# Patient Record
Sex: Female | Born: 1985 | Hispanic: No | Marital: Married | Smoking: Never smoker
Health system: Southern US, Community
[De-identification: ages and names within clinical notes are randomized; demographics above are authoritative.]

## PROBLEM LIST (undated history)

## (undated) ENCOUNTER — Inpatient Hospital Stay (HOSPITAL_COMMUNITY): Payer: Self-pay

## (undated) DIAGNOSIS — B019 Varicella without complication: Secondary | ICD-10-CM

## (undated) DIAGNOSIS — Q059 Spina bifida, unspecified: Secondary | ICD-10-CM

## (undated) DIAGNOSIS — Z789 Other specified health status: Secondary | ICD-10-CM

## (undated) HISTORY — DX: Varicella without complication: B01.9

---

## 2005-10-21 HISTORY — PX: BREAST BIOPSY: SHX20

## 2005-10-21 HISTORY — PX: BREAST SURGERY: SHX581

## 2015-04-18 ENCOUNTER — Encounter (HOSPITAL_COMMUNITY): Payer: Self-pay | Admitting: Emergency Medicine

## 2015-04-18 DIAGNOSIS — M549 Dorsalgia, unspecified: Secondary | ICD-10-CM | POA: Insufficient documentation

## 2015-04-18 DIAGNOSIS — B349 Viral infection, unspecified: Secondary | ICD-10-CM | POA: Diagnosis not present

## 2015-04-18 DIAGNOSIS — R109 Unspecified abdominal pain: Secondary | ICD-10-CM | POA: Insufficient documentation

## 2015-04-18 DIAGNOSIS — R110 Nausea: Secondary | ICD-10-CM | POA: Diagnosis not present

## 2015-04-18 DIAGNOSIS — R600 Localized edema: Secondary | ICD-10-CM | POA: Insufficient documentation

## 2015-04-18 DIAGNOSIS — Z3202 Encounter for pregnancy test, result negative: Secondary | ICD-10-CM | POA: Insufficient documentation

## 2015-04-18 DIAGNOSIS — R000 Tachycardia, unspecified: Secondary | ICD-10-CM | POA: Diagnosis not present

## 2015-04-18 LAB — CBC
HCT: 38.5 % (ref 36.0–46.0)
Hemoglobin: 13.5 g/dL (ref 12.0–15.0)
MCH: 29.2 pg (ref 26.0–34.0)
MCHC: 35.1 g/dL (ref 30.0–36.0)
MCV: 83.3 fL (ref 78.0–100.0)
PLATELETS: 179 10*3/uL (ref 150–400)
RBC: 4.62 MIL/uL (ref 3.87–5.11)
RDW: 12.3 % (ref 11.5–15.5)
WBC: 22.7 10*3/uL — ABNORMAL HIGH (ref 4.0–10.5)

## 2015-04-18 LAB — BASIC METABOLIC PANEL
ANION GAP: 8 (ref 5–15)
BUN: 8 mg/dL (ref 6–20)
CHLORIDE: 103 mmol/L (ref 101–111)
CO2: 24 mmol/L (ref 22–32)
CREATININE: 0.8 mg/dL (ref 0.44–1.00)
Calcium: 9 mg/dL (ref 8.9–10.3)
GFR calc Af Amer: >60 mL/min (ref >60)
GFR calc non Af Amer: >60 mL/min (ref >60)
Glucose, Bld: 118 mg/dL — ABNORMAL HIGH (ref 65–99)
Potassium: 3.8 mmol/L (ref 3.5–5.1)
Sodium: 135 mmol/L (ref 135–145)

## 2015-04-18 LAB — URINALYSIS, ROUTINE W REFLEX MICROSCOPIC
Glucose, UA: NEGATIVE mg/dL
KETONES UR: NEGATIVE mg/dL
Leukocytes, UA: NEGATIVE
Nitrite: NEGATIVE
Protein, ur: NEGATIVE mg/dL
Specific Gravity, Urine: 1.018 (ref 1.005–1.030)
UROBILINOGEN UA: 1 mg/dL (ref 0.0–1.0)
pH: 6 (ref 5.0–8.0)

## 2015-04-18 LAB — URINE MICROSCOPIC-ADD ON

## 2015-04-18 LAB — POC URINE PREG, ED: Preg Test, Ur: NEGATIVE

## 2015-04-18 NOTE — ED Notes (Addendum)
Pt. arrived with EMS from home reports bilateral flank pain with headache, emesis and low grade fever onset this morning . Denies dysuria or hematuria.

## 2015-04-19 ENCOUNTER — Emergency Department (HOSPITAL_COMMUNITY): Payer: BLUE CROSS/BLUE SHIELD

## 2015-04-19 ENCOUNTER — Emergency Department (HOSPITAL_COMMUNITY)
Admission: EM | Admit: 2015-04-19 | Discharge: 2015-04-19 | Disposition: A | Discharge status: Home / Self Care | Payer: BLUE CROSS/BLUE SHIELD | Attending: Emergency Medicine | Admitting: Emergency Medicine

## 2015-04-19 ENCOUNTER — Encounter (HOSPITAL_COMMUNITY): Payer: Self-pay | Admitting: Radiology

## 2015-04-19 DIAGNOSIS — R109 Unspecified abdominal pain: Secondary | ICD-10-CM

## 2015-04-19 DIAGNOSIS — B349 Viral infection, unspecified: Secondary | ICD-10-CM

## 2015-04-19 DIAGNOSIS — M791 Myalgia, unspecified site: Secondary | ICD-10-CM

## 2015-04-19 MED ORDER — IOHEXOL 300 MG/ML  SOLN
100.0000 mL | Freq: Once | INTRAMUSCULAR | Status: AC | PRN
  Administered 2015-04-19: 100 mL via INTRAVENOUS

## 2015-04-19 MED ORDER — SODIUM CHLORIDE 0.9 % IV BOLUS (SEPSIS)
1000.0000 mL | Freq: Once | INTRAVENOUS | Status: AC
  Administered 2015-04-19: 1000 mL via INTRAVENOUS

## 2015-04-19 MED ORDER — ONDANSETRON HCL 4 MG/2ML IJ SOLN
4.0000 mg | Freq: Once | INTRAMUSCULAR | Status: AC
  Administered 2015-04-19: 4 mg via INTRAVENOUS
  Filled 2015-04-19: qty 2

## 2015-04-19 MED ORDER — IBUPROFEN 200 MG PO TABS: 400.0000 mg | ORAL_TABLET | Freq: Four times a day (QID) | ORAL | Status: DC | PRN

## 2015-04-19 MED ORDER — MORPHINE SULFATE 4 MG/ML IJ SOLN
4.0000 mg | Freq: Once | INTRAMUSCULAR | Status: AC
  Administered 2015-04-19: 4 mg via INTRAVENOUS
  Filled 2015-04-19: qty 1

## 2015-04-19 NOTE — ED Notes (Signed)
Gave pt apple juice, per Dr. Wilkie AyeHorton.

## 2015-04-19 NOTE — ED Notes (Signed)
Pt tolerated fluids well.  

## 2015-04-19 NOTE — ED Provider Notes (Signed)
CSN: [id number]     Arrival date & time 04/18/15  2048 History  This chart was scribed for Kelly Baton, MD by Annye Asa, ED Scribe. This patient was seen in room D36C/D36C and the patient's care was started at 12:37 AM.    Chief Complaint  Patient presents with  . Flank Pain   The history is provided by the patient. No language interpreter was used.     HPI Comments: Kelly Roman is a 29 y.o. female brought in by ambulance who presents to the Emergency Department complaining of "throbbing" headache, flank pain, and fever. Reports chills this morning without documented fever. She also notes flank pain (L >R), radiating into her lower abdomen, with associated nausea. Patient denies sore throat, neck stiffness, cough, vomiting, diarrhea, dysuria, hematuria. She denies rashes, tick exposure. She denies drug use or history of cancer.  Denies recent travel outside the country.  Patient describes her current level of lower extremity swelling as normal.   History reviewed. No pertinent past medical history. History reviewed. No pertinent past surgical history. No family history on file. History  Substance Use Topics  . Smoking status: Never Smoker   . Smokeless tobacco: Not on file  . Alcohol Use: No   OB History    No data available     Review of Systems  Constitutional: Positive for chills. Negative for fever.  HENT: Negative for sore throat.   Eyes: Negative for photophobia.  Respiratory: Negative for cough, chest tightness and shortness of breath.   Cardiovascular: Negative for chest pain.  Gastrointestinal: Positive for nausea. Negative for vomiting and abdominal pain.  Genitourinary: Positive for flank pain. Negative for dysuria.  Musculoskeletal: Positive for back pain. Negative for neck pain and neck stiffness.  Skin: Negative for wound.  Neurological: Positive for headaches.  Psychiatric/Behavioral: Negative for confusion.  All other systems reviewed and are  negative.     Allergies  Review of patient's allergies indicates no known allergies.  Home Medications   Prior to Admission medications   Medication Sig Start Date End Date Taking? Authorizing Provider  ibuprofen (ADVIL,MOTRIN) 200 MG tablet Take 2 tablets (400 mg total) by mouth every 6 (six) hours as needed for mild pain. 04/19/15   Kelly Baton, MD   BP 95/57 mmHg  Pulse 95  Temp(Src) 97.8 F (36.6 C) (Rectal)  Resp 17  SpO2 100%  LMP 04/11/2015 Physical Exam  Constitutional: She is oriented to person, place, and time. She appears well-developed and well-nourished. No distress.  HENT:  Head: Normocephalic and atraumatic.  Mouth/Throat: Oropharynx is clear and moist.  Eyes: Pupils are equal, round, and reactive to light.  Cardiovascular: Regular rhythm and normal heart sounds.   Tachycardia  Pulmonary/Chest: Effort normal. No respiratory distress. She has no wheezes.  Abdominal: Soft. Bowel sounds are normal. There is no tenderness. There is no rebound and no guarding.  Genitourinary:  No CVA tenderness  Musculoskeletal: She exhibits edema.  1+ lower extremity edema, symmetric  Neurological: She is alert and oriented to person, place, and time.  Skin: Skin is warm and dry.  Psychiatric: She has a normal mood and affect.  Nursing note and vitals reviewed.   ED Course  Procedures   DIAGNOSTIC STUDIES: Oxygen Saturation is 100% on RA, normal by my interpretation.    COORDINATION OF CARE: 12:42 AM Discussed treatment plan with pt at bedside and pt agreed to plan.   Labs Review Labs Reviewed  BASIC METABOLIC PANEL - Abnormal; Notable  for the following:    Glucose, Bld 118 (*)    All other components within normal limits  CBC - Abnormal; Notable for the following:    WBC 22.7 (*)    All other components within normal limits  URINALYSIS, ROUTINE W REFLEX MICROSCOPIC (NOT AT [facility]) - Abnormal; Notable for the following:    Hgb urine dipstick SMALL (*)     Bilirubin Urine SMALL (*)    All other components within normal limits  URINE MICROSCOPIC-ADD ON - Abnormal; Notable for the following:    Squamous Epithelial / LPF FEW (*)    Bacteria, UA FEW (*)    All other components within normal limits  POC URINE PREG, ED    Imaging Review Ct Abdomen Pelvis W Contrast  04/19/2015   CLINICAL DATA:  Bilateral flank pain with emesis and low-grade fever, onset this morning.  EXAM: CT ABDOMEN AND PELVIS WITH CONTRAST  TECHNIQUE: Multidetector CT imaging of the abdomen and pelvis was performed using the standard protocol following bolus administration of intravenous contrast.  CONTRAST:  100mL OMNIPAQUE IOHEXOL 300 MG/ML  SOLN  COMPARISON:  None.  FINDINGS: Lower chest:  No significant abnormality  Hepatobiliary: There are right hepatic lobe hypodensities, too small to characterize but more likely benign cysts. The liver is otherwise normal in appearance. The gallbladder and bile ducts appear normal.  Pancreas: Normal  Spleen: Normal  Adrenals/Urinary Tract: The adrenals and kidneys are normal in appearance. There is no urinary calculus evident. There is no hydronephrosis or ureteral dilatation. Collecting systems and ureters appear unremarkable.  Stomach/Bowel: There are normal appearances of the stomach, small bowel and colon. The appendix is normal.  Vascular/Lymphatic: The abdominal aorta is normal in caliber. There is no atherosclerotic calcification. There is no adenopathy in the abdomen or pelvis.  Reproductive: The uterus and ovaries appear normal.  Other: No inflammatory changes are evident in the abdomen or pelvis. There is no ascites.  Musculoskeletal: No significant abnormality.  IMPRESSION: No significant abnormality.   Electronically Signed   By: Ellery Plunkaniel R Mitchell M.D.   On: 04/19/2015 04:32   Dg Abd Acute W/chest  04/19/2015   CLINICAL DATA:  Lower abdominal pain tonight. Flank pain. Headache and fever.  EXAM: DG ABDOMEN ACUTE W/ 1V CHEST  COMPARISON:   None.  FINDINGS: Lung volumes are low. The cardiomediastinal contours are normal. There is no free intra-abdominal air. No dilated bowel loops to suggest obstruction. Small volume of stool throughout the colon. No radiopaque calculi. No acute osseous abnormalities are seen. Incidental note of non fusion of the posterior elements of S1.  IMPRESSION: 1. No bowel obstruction or free air.  No radiopaque calculi. 2. Hypoventilatory chest.   Electronically Signed   By: Rubye OaksMelanie  Ehinger M.D.   On: 04/19/2015 01:52     EKG Interpretation None      MDM   Final diagnoses:  Myalgia  Flank pain  Viral syndrome    patient presents with chills, headache, bilateral flank pain from home. Nontoxic on exam. Afebrile. Was noted to be tachycardic. Exam is largely unremarkable. No abdominal tenderness or obvious signs of infection.  Patient given fluids and pain medicine.  Initial lab work notable for white count of 22.7. No evidence of urinary tract infection. Acute abdominal series without evidence of pneumonia or intra-abdominal process. On recheck, patient reports some improvement of symptoms but continues to endorse bilateral flank pain. It is unclear whether this is more myalgia versus actual flank pain. However, given the leukocytosis,  CT scan obtained. CT is negative for acute intra-abdominal process. Patient reports improvement of symptoms after fluids and pain medication. Suspect patient's symptoms are likely of viral syndrome with myalgias, chills, headache.  Patient has no evidence of neck stiffness or signs or symptoms of meningitis. Discussed with patient supportive care at home. She is to follow-up with her primary physician in one day for recheck.  After history, exam, and medical workup I feel the patient has been appropriately medically screened and is safe for discharge home. Pertinent diagnoses were discussed with the patient. Patient was given return precautions.  I personally performed the  services described in this documentation, which was scribed in my presence. The recorded information has been reviewed and is accurate.      Kelly Baton, MD 04/19/15 [phone]

## 2015-04-19 NOTE — Discharge Instructions (Signed)
You were seen today for back pain, chills at home, headache. Your workup is reassuring. Your symptoms are most suggestive of a viral illness. However, you need to follow-up with her primary physician if symptoms do not improve.  Viral Infections A virus is a type of germ. Viruses can cause:  Minor sore throats.  Aches and pains.  Headaches.  Runny nose.  Rashes.  Watery eyes.  Tiredness.  Coughs.  Loss of appetite.  Feeling sick to your stomach (nausea).  Throwing up (vomiting).  Watery poop (diarrhea). HOME CARE   Only take medicines as told by your doctor.  Drink enough water and fluids to keep your pee (urine) clear or pale yellow. Sports drinks are a good choice.  Get plenty of rest and eat healthy. Soups and broths with crackers or rice are fine. GET HELP RIGHT AWAY IF:   You have a very bad headache.  You have shortness of breath.  You have chest pain or neck pain.  You have an unusual rash.  You cannot stop throwing up.  You have watery poop that does not stop.  You cannot keep fluids down.  You or your child has a temperature by mouth above 102 F (38.9 C), not controlled by medicine.  Your baby is older than 3 months with a rectal temperature of 102 F (38.9 C) or higher.  Your baby is 713 months old or younger with a rectal temperature of 100.4 F (38 C) or higher. MAKE SURE YOU:   Understand these instructions.  Will watch this condition.  Will get help right away if you are not doing well or get worse. Document Released: 09/19/2008 Document Revised: 12/30/2011 Document Reviewed: 02/12/2011 [facility] Patient Information 2015 [place] [place]. This information is not intended to replace advice given to you by your health care provider. Make sure you discuss any questions you have with your health care provider.

## 2018-03-31 LAB — OB RESULTS CONSOLE GC/CHLAMYDIA: Gonorrhea: NEGATIVE

## 2018-04-16 LAB — OB RESULTS CONSOLE ANTIBODY SCREEN: Antibody Screen: NEGATIVE

## 2018-04-16 LAB — OB RESULTS CONSOLE HEPATITIS B SURFACE ANTIGEN: Hepatitis B Surface Ag: NEGATIVE

## 2018-04-16 LAB — OB RESULTS CONSOLE ABO/RH: RH Type: POSITIVE

## 2018-04-16 LAB — OB RESULTS CONSOLE HIV ANTIBODY (ROUTINE TESTING): HIV: NONREACTIVE

## 2018-04-16 LAB — OB RESULTS CONSOLE RUBELLA ANTIBODY, IGM: Rubella: IMMUNE

## 2018-05-23 ENCOUNTER — Inpatient Hospital Stay (HOSPITAL_COMMUNITY)
Admission: AD | Admit: 2018-05-23 | Discharge: 2018-05-23 | Disposition: A | Discharge status: Home / Self Care | Payer: Medicaid Other | Source: Ambulatory Visit | Attending: Obstetrics and Gynecology | Admitting: Obstetrics and Gynecology

## 2018-05-23 ENCOUNTER — Other Ambulatory Visit: Payer: Self-pay

## 2018-05-23 ENCOUNTER — Encounter (HOSPITAL_COMMUNITY): Payer: Self-pay

## 2018-05-23 DIAGNOSIS — R109 Unspecified abdominal pain: Secondary | ICD-10-CM | POA: Diagnosis not present

## 2018-05-23 DIAGNOSIS — O26892 Other specified pregnancy related conditions, second trimester: Secondary | ICD-10-CM | POA: Insufficient documentation

## 2018-05-23 DIAGNOSIS — R1030 Lower abdominal pain, unspecified: Secondary | ICD-10-CM | POA: Diagnosis present

## 2018-05-23 DIAGNOSIS — Z3A16 16 weeks gestation of pregnancy: Secondary | ICD-10-CM | POA: Diagnosis not present

## 2018-05-23 HISTORY — DX: Other specified health status: Z78.9

## 2018-05-23 LAB — URINALYSIS, ROUTINE W REFLEX MICROSCOPIC
Bilirubin Urine: NEGATIVE
Glucose, UA: NEGATIVE mg/dL
Hgb urine dipstick: NEGATIVE
Ketones, ur: NEGATIVE mg/dL
Leukocytes, UA: NEGATIVE
Nitrite: NEGATIVE
Protein, ur: NEGATIVE mg/dL
Specific Gravity, Urine: 1.013 (ref 1.005–1.030)
pH: 6 (ref 5.0–8.0)

## 2018-05-23 NOTE — MAU Provider Note (Signed)
History   G1 at approximately [redacted] weeks gestation who is a patient of Dr. Delray Altallahan's in with low abdominal pain since yesterday.  Patient describes it as a sharp pulling type pain.  She denies vaginal bleeding or rupture membranes.  CSN: [id number]  Arrival date & time 05/23/18  1359   None     Chief Complaint  Patient presents with  . Abdominal Pain    HPI  Past Medical History:  Diagnosis Date  . Medical history non-contributory     Past Surgical History:  Procedure Laterality Date  . BREAST SURGERY Right 2007   cyst removed     No family history on file.  Social History   Tobacco Use  . Smoking status: Never Smoker  Substance Use Topics  . Alcohol use: No  . Drug use: No    OB History    Gravida  1   Para      Term      Preterm      AB      Living        SAB      TAB      Ectopic      Multiple      Live Births              Review of Systems  Constitutional: Negative.   HENT: Negative.   Eyes: Negative.   Respiratory: Negative.   Cardiovascular: Negative.   Gastrointestinal: Positive for abdominal pain.  Endocrine: Negative.   Genitourinary: Negative.   Musculoskeletal: Negative.   Skin: Negative.   Allergic/Immunologic: Negative.   Neurological: Negative.   Hematological: Negative.   Psychiatric/Behavioral: Negative.     Allergies  Patient has no known allergies.  Home Medications    BP 114/70 (BP Location: Left Arm)   Pulse 99   Temp 98.2 F (36.8 C) (Oral)   Resp 18   Ht 5\' 2"  (1.575 m)   Wt 178 lb 1.3 oz (80.8 kg)   LMP 01/26/2018 (Exact Date)   SpO2 99%   BMI 32.57 kg/m   Physical Exam  Constitutional: She is oriented to person, place, and time. She appears well-developed and well-nourished.  HENT:  Head: Normocephalic.  Cardiovascular: Normal rate and regular rhythm.  Pulmonary/Chest: Effort normal and breath sounds normal.  Abdominal: Soft. Normal appearance and bowel sounds are normal.  Genitourinary:  Rectum normal, vagina normal and uterus normal.  Neurological: She is alert and oriented to person, place, and time.  Skin: Skin is warm.  Psychiatric: She has a normal mood and affect. Her behavior is normal.    MAU Course  Procedures (including critical care time)  Labs Reviewed  URINALYSIS, ROUTINE W REFLEX MICROSCOPIC   No results found.   No diagnosis found.    MDM  Vital signs are stable.  Fetal heart rate is 152 strong and regular per Doppler.  Abdomen is soft nontender per palpation at a -2 U.  Vaginal exam performed cervix is firm closed posterior and high.  No abnormal discharge present. U/A normal POC discussed with Dr. Claiborne Billingsallahan to be d/c home in stable condition

## 2018-05-23 NOTE — Discharge Instructions (Signed)

## 2018-05-23 NOTE — MAU Note (Signed)
Pt. States lower abd. Pain started yesterday. Pt. Denies vag bleeding or dc.

## 2018-06-10 ENCOUNTER — Ambulatory Visit: Payer: Medicaid Other | Admitting: Diagnostic Neuroimaging

## 2018-06-16 ENCOUNTER — Ambulatory Visit: Payer: Medicaid Other | Admitting: Diagnostic Neuroimaging

## 2018-06-16 ENCOUNTER — Encounter: Payer: Self-pay | Admitting: Diagnostic Neuroimaging

## 2018-06-16 VITALS — BP 112/64 | HR 74 | Ht 62.0 in | Wt 186.0 lb

## 2018-06-16 DIAGNOSIS — Q675 Congenital deformity of spine: Secondary | ICD-10-CM

## 2018-06-16 NOTE — Patient Instructions (Signed)
followup as needed

## 2018-06-16 NOTE — Progress Notes (Signed)
[facility]  PATIENT: Kelly Roman DOB: Aug 01, 1986  REFERRING CLINICIAN: Claiborne Billingsallahan HISTORY FROM: patient and husband  REASON FOR VISIT: new consult   HISTORICAL  CHIEF COMPLAINT:  Chief Complaint  Patient presents with  . New Patient (Initial Visit)    Here with husband in room 6,   . Back Pain    Here to discuss lower back pain. Patient is [redacted] weeks pregnant and was told in the past she has a non-closure of lower vetebrae. Patient wants to determine if this will be a issue for her pregnancy.     HISTORY OF PRESENT ILLNESS:   32 year old female, currently [redacted] weeks pregnant, here for evaluation of history of low back problem.  When patient was younger, she had some back pain with heavy lifting, had MRI of the lumbar spine was diagnosed with some type of "opening" in the lower spine.  This sounds like some type of mild neural tube defect, such as spina bifida, per patient's description.  However patient did not require any surgical intervention.  No visible deformity noticed externally.  Patient was advised to avoid heavy lifting but otherwise had no other specific treatments.  At the patient is pregnant, she is concerned about whether or not this problem and history of spinal bifida or spinal dysraphism could pose any complication as she proceeds in her pregnancy and to delivery.  Currently patient does not have a low back pain, lumbar radicular pain symptoms, lower extremity numbness or weakness.  Current pregnancy is otherwise uncomplicated.   REVIEW OF SYSTEMS: Full 14 system review of systems performed and negative with exception of: Constipation skin sensitivity.  ALLERGIES: No Known Allergies  HOME MEDICATIONS: Outpatient Medications Prior to Visit  Medication Sig Dispense Refill  . Prenatal Vit-Fe Fumarate-FA (PRENATAL MULTIVITAMIN) TABS tablet Take 1 tablet by mouth daily at 12 noon.     No facility-administered medications prior to visit.      PAST MEDICAL HISTORY: Past Medical History:  Diagnosis Date  . Chicken pox   . Medical history non-contributory     PAST SURGICAL HISTORY: Past Surgical History:  Procedure Laterality Date  . BREAST BIOPSY  2007   Right side   . BREAST SURGERY Right 2007   cyst removed     FAMILY HISTORY: Family History  Problem Relation Age of Onset  . Other Mother     SOCIAL HISTORY: Social History   Socioeconomic History  . Marital status: Married    Spouse name: Not on file  . Number of children: Not on file  . Years of education: Not on file  . Highest education level: Some college, no degree  Occupational History    Comment: Unemployed   Social Needs  . Financial resource strain: Not on file  . Food insecurity:    Worry: Not on file    Inability: Not on file  . Transportation needs:    Medical: Not on file    Non-medical: Not on file  Tobacco Use  . Smoking status: Never Smoker  . Smokeless tobacco: Never Used  Substance and Sexual Activity  . Alcohol use: No  . Drug use: No  . Sexual activity: Not on file  Lifestyle  . Physical activity:    Days per week: Not on file    Minutes per session: Not on file  . Stress: Not on file  Relationships  . Social connections:    Talks on phone: Not on file    Gets together: Not on  file    Attends religious service: Not on file    Active member of club or organization: Not on file    Attends meetings of clubs or organizations: Not on file    Relationship status: Not on file  . Intimate partner violence:    Fear of current or ex partner: Not on file    Emotionally abused: Not on file    Physically abused: Not on file    Forced sexual activity: Not on file  Other Topics Concern  . Not on file  Social History Narrative   Left handed, occasional caffeine lives with husband        PHYSICAL EXAM  GENERAL EXAM/CONSTITUTIONAL: Vitals:  Vitals:   06/16/18 1430  BP: 112/64  Pulse: 74  Weight: 186 lb (84.4 kg)   Height: 5\' 2"  (1.575 m)     Body mass index is 34.02 kg/m. Wt Readings from Last 3 Encounters:  06/16/18 186 lb (84.4 kg)  05/23/18 178 lb 1.3 oz (80.8 kg)     Patient is in no distress; well developed, nourished and groomed; neck is supple  CARDIOVASCULAR:  Examination of carotid arteries is normal; no carotid bruits  Regular rate and rhythm, no murmurs  Examination of peripheral vascular system by observation and palpation is normal  EYES:  Ophthalmoscopic exam of optic discs and posterior segments is normal; no papilledema or hemorrhages  Visual Acuity Screening   Right eye Left eye Both eyes  Without correction: 20/30 20/20   With correction:        MUSCULOSKELETAL:  Gait, strength, tone, movements noted in Neurologic exam below  NEUROLOGIC: MENTAL STATUS:  No flowsheet data found.  awake, alert, oriented to person, place and time  recent and remote memory intact  normal attention and concentration  language fluent, comprehension intact, naming intact  fund of knowledge appropriate  CRANIAL NERVE:   2nd - no papilledema on fundoscopic exam  2nd, 3rd, 4th, 6th - pupils equal and reactive to light, visual fields full to confrontation, extraocular muscles intact, no nystagmus  5th - facial sensation symmetric  7th - facial strength symmetric  8th - hearing intact  9th - palate elevates symmetrically, uvula midline  11th - shoulder shrug symmetric  12th - tongue protrusion midline  MOTOR:   normal bulk and tone, full strength in the BUE, BLE  SENSORY:   normal and symmetric to light touch, temperature, vibration  COORDINATION:   finger-nose-finger, fine finger movements normal  REFLEXES:   deep tendon reflexes present and symmetric  GAIT/STATION:   narrow based gait; able to walk on toes, heels; romberg is negative     DIAGNOSTIC DATA (LABS, IMAGING, TESTING) - I reviewed patient records, labs, notes, testing and imaging  myself where available.  Lab Results  Component Value Date   WBC 22.7 (H) 04/18/2015   HGB 13.5 04/18/2015   HCT 38.5 04/18/2015   MCV 83.3 04/18/2015   PLT 179 04/18/2015      Component Value Date/Time   NA 135 04/18/2015 2103   K 3.8 04/18/2015 2103   CL 103 04/18/2015 2103   CO2 24 04/18/2015 2103   GLUCOSE 118 (H) 04/18/2015 2103   BUN 8 04/18/2015 2103   CREATININE 0.80 04/18/2015 2103   CALCIUM 9.0 04/18/2015 2103   GFRNONAA >60 04/18/2015 2103   GFRAA >60 04/18/2015 2103   No results found for: CHOL, HDL, LDLCALC, LDLDIRECT, TRIG, CHOLHDL No results found for: ZOXW9U No results found for: VITAMINB12 No results  found for: TSH   04/19/15 CT abd/pelvis [I reviewed images myself and agree with interpretation. At L3-4 and L4-5, suggestion of subtle spinal dysraphism. -VRP]  - No significant abnormality.     ASSESSMENT AND PLAN  32 y.o. 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  I reviewed CT abdomen pelvis imaging and was able to review the lumbar spine.  There is suggestion of subtle spinal dysraphism at the L3-4 and L4-5 level, but no other significant finding.   Dx: ? spina bifida occulta or other spinal dysraphism (mild; asymptomatic)  1. Occult spinal dysraphism      PLAN:  - this is a mild finding by history; I do not anticipate this will cause any issue with pregnancy or delivery - after delivery, may consider MRI lumbar spine if patient develops lumbar radiculopathy symptoms  Return if symptoms worsen or fail to improve. follow up as needed    Suanne Marker, MD 06/16/2018, 2:41 PM Certified in Neurology, Neurophysiology and Neuroimaging  [facility] [address] [place], [place] [address] [phone]

## 2018-08-04 LAB — OB RESULTS CONSOLE RPR: RPR: NONREACTIVE

## 2018-10-08 LAB — OB RESULTS CONSOLE GBS: GBS: NEGATIVE

## 2018-10-31 ENCOUNTER — Encounter (HOSPITAL_COMMUNITY): Payer: Self-pay | Admitting: *Deleted

## 2018-10-31 ENCOUNTER — Inpatient Hospital Stay (HOSPITAL_COMMUNITY)
Admission: AD | Admit: 2018-10-31 | Discharge: 2018-11-03 | DRG: 788 | Disposition: A | Discharge status: Home / Self Care | Payer: Medicaid Other | Attending: Obstetrics and Gynecology | Admitting: Obstetrics and Gynecology

## 2018-10-31 ENCOUNTER — Inpatient Hospital Stay (HOSPITAL_COMMUNITY): Payer: Medicaid Other | Admitting: Anesthesiology

## 2018-10-31 DIAGNOSIS — O322XX0 Maternal care for transverse and oblique lie, not applicable or unspecified: Secondary | ICD-10-CM | POA: Diagnosis present

## 2018-10-31 DIAGNOSIS — Z3483 Encounter for supervision of other normal pregnancy, third trimester: Secondary | ICD-10-CM | POA: Diagnosis present

## 2018-10-31 DIAGNOSIS — O4693 Antepartum hemorrhage, unspecified, third trimester: Secondary | ICD-10-CM | POA: Diagnosis not present

## 2018-10-31 DIAGNOSIS — Z3A39 39 weeks gestation of pregnancy: Secondary | ICD-10-CM | POA: Diagnosis not present

## 2018-10-31 DIAGNOSIS — O324XX0 Maternal care for high head at term, not applicable or unspecified: Secondary | ICD-10-CM | POA: Diagnosis present

## 2018-10-31 HISTORY — DX: Spina bifida, unspecified: Q05.9

## 2018-10-31 LAB — URINALYSIS, ROUTINE W REFLEX MICROSCOPIC
Bilirubin Urine: NEGATIVE
Glucose, UA: NEGATIVE mg/dL
KETONES UR: NEGATIVE mg/dL
Nitrite: NEGATIVE
PH: 6 (ref 5.0–8.0)
PROTEIN: NEGATIVE mg/dL
Specific Gravity, Urine: 1.018 (ref 1.005–1.030)

## 2018-10-31 LAB — TYPE AND SCREEN
ABO/RH(D): O POS
ANTIBODY SCREEN: NEGATIVE

## 2018-10-31 LAB — CBC
HCT: 36.5 % (ref 36.0–46.0)
Hemoglobin: 12.4 g/dL (ref 12.0–15.0)
MCH: 30.9 pg (ref 26.0–34.0)
MCHC: 34 g/dL (ref 30.0–36.0)
MCV: 91 fL (ref 80.0–100.0)
NRBC: 0 % (ref 0.0–0.2)
Platelets: 162 10*3/uL (ref 150–400)
RBC: 4.01 MIL/uL (ref 3.87–5.11)
RDW: 12.8 % (ref 11.5–15.5)
WBC: 12.5 10*3/uL — ABNORMAL HIGH (ref 4.0–10.5)

## 2018-10-31 LAB — RPR: RPR Ser Ql: NONREACTIVE

## 2018-10-31 LAB — ABO/RH: ABO/RH(D): O POS

## 2018-10-31 MED ORDER — ONDANSETRON HCL 4 MG/2ML IJ SOLN: 4.0000 mg | Freq: Four times a day (QID) | INTRAMUSCULAR | Status: DC | PRN

## 2018-10-31 MED ORDER — PHENYLEPHRINE 40 MCG/ML (10ML) SYRINGE FOR IV PUSH (FOR BLOOD PRESSURE SUPPORT)
80.0000 ug | PREFILLED_SYRINGE | INTRAVENOUS | Status: DC | PRN
  Filled 2018-10-31: qty 10

## 2018-10-31 MED ORDER — OXYCODONE-ACETAMINOPHEN 5-325 MG PO TABS: 1.0000 | ORAL_TABLET | ORAL | Status: DC | PRN

## 2018-10-31 MED ORDER — OXYTOCIN BOLUS FROM INFUSION: 500.0000 mL | Freq: Once | INTRAVENOUS | Status: DC

## 2018-10-31 MED ORDER — DIPHENHYDRAMINE HCL 50 MG/ML IJ SOLN: 12.5000 mg | INTRAMUSCULAR | Status: DC | PRN

## 2018-10-31 MED ORDER — LACTATED RINGERS IV SOLN
500.0000 mL | Freq: Once | INTRAVENOUS | Status: AC
  Administered 2018-10-31: 500 mL via INTRAVENOUS

## 2018-10-31 MED ORDER — LIDOCAINE HCL (PF) 1 % IJ SOLN
INTRAMUSCULAR | Status: DC | PRN
  Administered 2018-10-31: 6 mL

## 2018-10-31 MED ORDER — LIDOCAINE HCL (PF) 1 % IJ SOLN: 30.0000 mL | INTRAMUSCULAR | Status: DC | PRN

## 2018-10-31 MED ORDER — LACTATED RINGERS IV SOLN
INTRAVENOUS | Status: DC
  Administered 2018-10-31 – 2018-11-01 (×4): via INTRAVENOUS

## 2018-10-31 MED ORDER — LIDOCAINE HCL (PF) 1 % IJ SOLN: INTRAMUSCULAR | Status: DC | PRN

## 2018-10-31 MED ORDER — OXYTOCIN 40 UNITS IN NORMAL SALINE INFUSION - SIMPLE MED
1.0000 m[IU]/min | INTRAVENOUS | Status: DC
  Administered 2018-10-31: 2 m[IU]/min via INTRAVENOUS
  Administered 2018-11-01: 10 m[IU]/min via INTRAVENOUS

## 2018-10-31 MED ORDER — MISOPROSTOL 25 MCG QUARTER TABLET
25.0000 ug | ORAL_TABLET | ORAL | Status: DC | PRN
  Administered 2018-10-31: 25 ug via VAGINAL
  Filled 2018-10-31: qty 1

## 2018-10-31 MED ORDER — TERBUTALINE SULFATE 1 MG/ML IJ SOLN: 0.2500 mg | Freq: Once | INTRAMUSCULAR | Status: DC | PRN

## 2018-10-31 MED ORDER — OXYTOCIN 40 UNITS IN NORMAL SALINE INFUSION - SIMPLE MED
2.5000 [IU]/h | INTRAVENOUS | Status: DC
  Filled 2018-10-31: qty 1000

## 2018-10-31 MED ORDER — LACTATED RINGERS IV SOLN: 500.0000 mL | INTRAVENOUS | Status: DC | PRN

## 2018-10-31 MED ORDER — EPHEDRINE 5 MG/ML INJ: 10.0000 mg | INTRAVENOUS | Status: DC | PRN

## 2018-10-31 MED ORDER — SOD CITRATE-CITRIC ACID 500-334 MG/5ML PO SOLN: 30.0000 mL | ORAL | Status: DC | PRN

## 2018-10-31 MED ORDER — FENTANYL 2.5 MCG/ML BUPIVACAINE 1/10 % EPIDURAL INFUSION (WH - ANES)
14.0000 mL/h | INTRAMUSCULAR | Status: DC | PRN
  Administered 2018-10-31 (×2): 14 mL/h via EPIDURAL
  Filled 2018-10-31 (×2): qty 100

## 2018-10-31 MED ORDER — PHENYLEPHRINE 40 MCG/ML (10ML) SYRINGE FOR IV PUSH (FOR BLOOD PRESSURE SUPPORT): 80.0000 ug | PREFILLED_SYRINGE | INTRAVENOUS | Status: DC | PRN

## 2018-10-31 MED ORDER — FENTANYL CITRATE (PF) 100 MCG/2ML IJ SOLN: 100.0000 ug | INTRAMUSCULAR | Status: DC | PRN

## 2018-10-31 MED ORDER — ACETAMINOPHEN 325 MG PO TABS: 650.0000 mg | ORAL_TABLET | ORAL | Status: DC | PRN

## 2018-10-31 MED ORDER — OXYCODONE-ACETAMINOPHEN 5-325 MG PO TABS: 2.0000 | ORAL_TABLET | ORAL | Status: DC | PRN

## 2018-10-31 NOTE — MAU Provider Note (Signed)
Patient Kelly Roman is a 33 y.o.  G1P0 At [redacted]w[redacted]d here with complaints of contractions and vaginal spotting one time this evening. She denies LOF, constant VB, abnormal discharge or decreased fetal movements. Per patient, she has had an uncomplicated prenatal course.  History     CSN: [id number]  Arrival date and time: 10/31/18 0450   None     Chief Complaint  Patient presents with  . Vaginal Bleeding  . Pelvic Pain   HPI Patient had light pink spotting today, as well as lost her mucous plug yesterday. She has been having off and on-again pain but states that it feels more like some pelvic pressure than contractions. She wants to be checked to see if she is dilated. No blood on her underwear.   OB History    Gravida  1   Para      Term      Preterm      AB      Living        SAB      TAB      Ectopic      Multiple      Live Births              Past Medical History:  Diagnosis Date  . Chicken pox   . Medical history non-contributory     Past Surgical History:  Procedure Laterality Date  . BREAST BIOPSY  2007   Right side   . BREAST SURGERY Right 2007   cyst removed     Family History  Problem Relation Age of Onset  . Other Mother     Social History   Tobacco Use  . Smoking status: Never Smoker  . Smokeless tobacco: Never Used  Substance Use Topics  . Alcohol use: No  . Drug use: No    Allergies: No Known Allergies  Medications Prior to Admission  Medication Sig Dispense Refill Last Dose  . Prenatal Vit-Fe Fumarate-FA (PRENATAL MULTIVITAMIN) TABS tablet Take 1 tablet by mouth daily at 12 noon.   10/30/2018 at Unknown time    Review of Systems  Constitutional: Negative.   HENT: Negative.   Respiratory: Negative.   Gastrointestinal: Positive for abdominal pain.  Genitourinary: Positive for vaginal bleeding.  Neurological: Negative.    Physical Exam   Blood pressure 95/65, pulse 91, temperature 97.7 F (36.5 C), temperature  source Oral, resp. rate 18, height 5\' 2"  (1.575 m), weight 95.3 kg, last menstrual period 01/26/2018, SpO2 99 %.  Physical Exam  Constitutional: She is oriented to person, place, and time. She appears well-developed and well-nourished.  HENT:  Head: Normocephalic.  Neck: Normal range of motion.  Respiratory: Effort normal.  GI: Soft.  Genitourinary:    Genitourinary Comments: NEFG; cervix is 1 cm; soft and middle/posterior but still thick.    Musculoskeletal: Normal range of motion.  Neurological: She is alert and oriented to person, place, and time.  Skin: Skin is warm.    MAU Course  Procedures  MDM -patient had cat1 tracing but at 0644 I was summoned to bedside due to sudden decel and lost of contact with FH. Patient repositioned and FH now in 90s with monitor. Bedside US confirmed heartbeat now in 110s-120s. -IV started; CNM and RN continuously at the bedside.  -NST: 135, mod var, present acel, no decels until sudden decel at [phone]. Patient reposition, IV started and baseline now 120 with mod variability, present acel, neg decels.    Of note, FH  initially on LLQ while patient had Cat 1 tracing; however, FHR now located on RLQ at 0648.  .  Assessment and Plan  - Dr. Tenny Crawoss immediately notified; she recommends admission and induction.  -Admission orders placed with plan for cytotec induction.  -Explained to patient that most likely baby moved, possibly rolling onto umbilical cord,  which accounted for sudden loss of contact and need for reposition. -Patient agrees with plan of care.   [name] 10/31/2018, 7:28 AM

## 2018-10-31 NOTE — Anesthesia Preprocedure Evaluation (Signed)
Anesthesia Evaluation  Patient identified by MRN, date of birth, ID band Patient awake    Reviewed: Allergy & Precautions, H&P , NPO status , Patient's Chart, lab work & pertinent test results, reviewed documented beta blocker date and time   Airway Mallampati: III  TM Distance: >3 FB Neck ROM: full    Dental no notable dental hx. (+) Teeth Intact   Pulmonary neg pulmonary ROS,    Pulmonary exam normal breath sounds clear to auscultation       Cardiovascular negative cardio ROS Normal cardiovascular exam Rhythm:regular Rate:Normal     Neuro/Psych negative neurological ROS  negative psych ROS   GI/Hepatic negative GI ROS, Neg liver ROS,   Endo/Other  negative endocrine ROS  Renal/GU negative Renal ROS  negative genitourinary   Musculoskeletal   Abdominal   Peds  Hematology negative hematology ROS (+)   Anesthesia Other Findings   Reproductive/Obstetrics (+) Pregnancy                             Anesthesia Physical Anesthesia Plan  ASA: II  Anesthesia Plan: Epidural   Post-op Pain Management:    Induction:   PONV Risk Score and Plan:   Airway Management Planned:   Additional Equipment:   Intra-op Plan:   Post-operative Plan:   Informed Consent: I have reviewed the patients History and Physical, chart, labs and discussed the procedure including the risks, benefits and alternatives for the proposed anesthesia with the patient or authorized representative who has indicated his/her understanding and acceptance.     Plan Discussed with:   Anesthesia Plan Comments:         Anesthesia Quick Evaluation

## 2018-10-31 NOTE — MAU Note (Signed)
FHR decel at 0643. Attempted to locate via fetal monitor.  Called provider to bedside to evaluate. B/S US performed. Fetal heart rate returned to baseline.  IV placed, labs drawn  and fluid started. Patient admitted for IOL.

## 2018-10-31 NOTE — Anesthesia Pain Management Evaluation Note (Signed)
  CRNA Pain Management Visit Note  Patient: Kelly Roman, 33 y.o., female  "Hello I am a member of the anesthesia team at [facility]. We have an anesthesia team available at all times to provide care throughout the hospital, including epidural management and anesthesia for C-section. I don't know your plan for the delivery whether it a natural birth, water birth, IV sedation, nitrous supplementation, doula or epidural, but we want to meet your pain goals."   1.Was your pain managed to your expectations on prior hospitalizations?   No prior hospitalizations  2.What is your expectation for pain management during this hospitalization?     Epidural  3.How can we help you reach that goal? Epidural redose  Record the patient's initial score and the patient's pain goal.   Pain: 0  Pain Goal: 9 [facility] wants you to be able to say your pain was always managed very well.  [facility] 10/31/2018

## 2018-10-31 NOTE — MAU Note (Signed)
Pt lost mucous plug yesterday. This morning has light spotting and pelvic pain but doesn't think it's contractions. Denies LOF. +FM.

## 2018-10-31 NOTE — H&P (Signed)
Kelly Roman is a 33 y.o. female presenting for spotting  33 yo G1P0 @ 39+6 presented to MAU c/o spotting. During  Cervical exam a deceleration in the fetal tracing occurred and the decision was made to keep the patient and augment labor. Her pregnancy has been otherwise uncomplicated OB History    Gravida  1   Para      Term      Preterm      AB      Living        SAB      TAB      Ectopic      Multiple      Live Births             Past Medical History:  Diagnosis Date  . Chicken pox   . Medical history non-contributory    Past Surgical History:  Procedure Laterality Date  . BREAST BIOPSY  2007   Right side   . BREAST SURGERY Right 2007   cyst removed    Family History: family history includes Other in her mother. Social History:  reports that she has never smoked. She has never used smokeless tobacco. She reports that she does not drink alcohol or use drugs.     Maternal Diabetes: No Genetic Screening: Normal Maternal Ultrasounds/Referrals: Normal Fetal Ultrasounds or other Referrals:  None Maternal Substance Abuse:  No Significant Maternal Medications:  None Significant Maternal Lab Results:  None Other Comments:  None  ROS History Dilation: 1.5 Effacement (%): 80 Station: -2 Exam by:: [email or web address] Blood pressure (!) 104/57, pulse 89, temperature 98.1 F (36.7 C), temperature source Oral, resp. rate 20, height 5\' 2"  (1.575 m), weight 95.3 kg, last menstrual period 01/26/2018, SpO2 99 %. Exam Physical Exam  Prenatal labs: ABO, Rh: --/--/O POS (01/11 6503) Antibody: NEG (01/11 0751) Rubella: Immune (06/27 0000) RPR: Nonreactive (10/15 0000)  HBsAg: Negative (06/27 0000)  HIV: Non-reactive (06/27 0000)  GBS: Negative (12/19 0000)   Assessment/Plan: 1) Admit 2) Misoprostal , AROM when able 3) Epidural on request   Waynard Reeds 10/31/2018, 9:52 AM

## 2018-10-31 NOTE — Anesthesia Procedure Notes (Signed)
Epidural Patient location during procedure: OB Start time: 10/31/2018 2:19 PM End time: 10/31/2018 2:22 PM  Staffing Anesthesiologist: Bethena Midget, MD  Preanesthetic Checklist Completed: patient identified, site marked, surgical consent, pre-op evaluation, timeout performed, IV checked, risks and benefits discussed and monitors and equipment checked  Epidural Patient position: sitting Prep: site prepped and draped and DuraPrep Patient monitoring: continuous pulse ox and blood pressure Approach: midline Location: L3-L4 Injection technique: LOR air  Needle:  Needle type: Tuohy  Needle gauge: 17 G Needle length: 9 cm and 9 Needle insertion depth: 7 cm Catheter type: closed end flexible Catheter size: 19 Gauge Catheter at skin depth: 12 cm Test dose: negative  Assessment Events: blood not aspirated, injection not painful, no injection resistance, negative IV test and no paresthesia

## 2018-10-31 NOTE — Progress Notes (Signed)
Patient ID: Kelly Roman, female   DOB: 1986-09-07, 33 y.o.   MRN: [id number]   S: Comfortable, only feeling mild contractions O: Vitals:   10/31/18 1043 10/31/18 1236 10/31/18 1237 10/31/18 1351  BP: (!) 94/53  99/64   Pulse: 77  89   Resp: 18 18 20 18   Temp:      TempSrc:      SpO2:      Weight:      Height:       AOX3, NAD Cvx 1/90/-2 toco irreg FHR 140 reactive, cat 1 tracing  AROM clear fluid  Start pitocin FWB reassuring

## 2018-11-01 ENCOUNTER — Encounter (HOSPITAL_COMMUNITY)
Admission: AD | Disposition: A | Discharge status: Home / Self Care | Payer: Self-pay | Source: Home / Self Care | Attending: Obstetrics and Gynecology

## 2018-11-01 ENCOUNTER — Other Ambulatory Visit: Payer: Self-pay

## 2018-11-01 ENCOUNTER — Encounter (HOSPITAL_COMMUNITY): Payer: Self-pay

## 2018-11-01 SURGERY — Surgical Case
Anesthesia: Epidural | Wound class: Clean Contaminated

## 2018-11-01 MED ORDER — DEXAMETHASONE SODIUM PHOSPHATE 4 MG/ML IJ SOLN
INTRAMUSCULAR | Status: DC | PRN
  Administered 2018-11-01: 4 mg via INTRAVENOUS

## 2018-11-01 MED ORDER — OXYTOCIN 10 UNIT/ML IJ SOLN
INTRAVENOUS | Status: DC | PRN
  Administered 2018-11-01: 40 [IU] via INTRAVENOUS

## 2018-11-01 MED ORDER — LACTATED RINGERS IV SOLN
INTRAVENOUS | Status: DC | PRN
  Administered 2018-11-01: 04:00:00 via INTRAVENOUS

## 2018-11-01 MED ORDER — SCOPOLAMINE 1 MG/3DAYS TD PT72
MEDICATED_PATCH | TRANSDERMAL | Status: DC | PRN
  Administered 2018-11-01: 1 via TRANSDERMAL

## 2018-11-01 MED ORDER — OXYTOCIN 40 UNITS IN NORMAL SALINE INFUSION - SIMPLE MED: 2.5000 [IU]/h | INTRAVENOUS | Status: AC

## 2018-11-01 MED ORDER — ACETAMINOPHEN 160 MG/5ML PO SOLN: 325.0000 mg | ORAL | Status: DC | PRN

## 2018-11-01 MED ORDER — SCOPOLAMINE 1 MG/3DAYS TD PT72
MEDICATED_PATCH | TRANSDERMAL | Status: AC
  Filled 2018-11-01: qty 1

## 2018-11-01 MED ORDER — MENTHOL 3 MG MT LOZG: 1.0000 | LOZENGE | OROMUCOSAL | Status: DC | PRN

## 2018-11-01 MED ORDER — SODIUM CHLORIDE 0.9 % IR SOLN
Status: DC | PRN
  Administered 2018-11-01: 1000 mL

## 2018-11-01 MED ORDER — OXYCODONE HCL 5 MG/5ML PO SOLN: 5.0000 mg | Freq: Once | ORAL | Status: DC | PRN

## 2018-11-01 MED ORDER — METHYLERGONOVINE MALEATE 0.2 MG PO TABS: 0.2000 mg | ORAL_TABLET | ORAL | Status: DC | PRN

## 2018-11-01 MED ORDER — MEPERIDINE HCL 25 MG/ML IJ SOLN
INTRAMUSCULAR | Status: AC
  Filled 2018-11-01: qty 1

## 2018-11-01 MED ORDER — SIMETHICONE 80 MG PO CHEW: 80.0000 mg | CHEWABLE_TABLET | ORAL | Status: DC | PRN

## 2018-11-01 MED ORDER — ACETAMINOPHEN 325 MG PO TABS: 325.0000 mg | ORAL_TABLET | ORAL | Status: DC | PRN

## 2018-11-01 MED ORDER — ONDANSETRON HCL 4 MG/2ML IJ SOLN
INTRAMUSCULAR | Status: DC | PRN
  Administered 2018-11-01: 4 mg via INTRAVENOUS

## 2018-11-01 MED ORDER — SIMETHICONE 80 MG PO CHEW
80.0000 mg | CHEWABLE_TABLET | ORAL | Status: DC
  Administered 2018-11-01 – 2018-11-03 (×2): 80 mg via ORAL
  Filled 2018-11-01 (×2): qty 1

## 2018-11-01 MED ORDER — SIMETHICONE 80 MG PO CHEW
80.0000 mg | CHEWABLE_TABLET | Freq: Three times a day (TID) | ORAL | Status: DC
  Administered 2018-11-01 – 2018-11-03 (×6): 80 mg via ORAL
  Filled 2018-11-01 (×6): qty 1

## 2018-11-01 MED ORDER — COCONUT OIL OIL: 1.0000 "application " | TOPICAL_OIL | Status: DC | PRN

## 2018-11-01 MED ORDER — ZOLPIDEM TARTRATE 5 MG PO TABS: 5.0000 mg | ORAL_TABLET | Freq: Every evening | ORAL | Status: DC | PRN

## 2018-11-01 MED ORDER — OXYCODONE-ACETAMINOPHEN 5-325 MG PO TABS: 1.0000 | ORAL_TABLET | ORAL | Status: DC | PRN

## 2018-11-01 MED ORDER — KETOROLAC TROMETHAMINE 30 MG/ML IJ SOLN
30.0000 mg | Freq: Four times a day (QID) | INTRAMUSCULAR | Status: DC
  Administered 2018-11-01 – 2018-11-02 (×5): 30 mg via INTRAVENOUS
  Filled 2018-11-01 (×5): qty 1

## 2018-11-01 MED ORDER — TETANUS-DIPHTH-ACELL PERTUSSIS 5-2.5-18.5 LF-MCG/0.5 IM SUSP: 0.5000 mL | Freq: Once | INTRAMUSCULAR | Status: DC

## 2018-11-01 MED ORDER — CEFAZOLIN SODIUM-DEXTROSE 2-4 GM/100ML-% IV SOLN
INTRAVENOUS | Status: AC
  Filled 2018-11-01: qty 100

## 2018-11-01 MED ORDER — LACTATED RINGERS IV SOLN
INTRAVENOUS | Status: DC | PRN
  Administered 2018-11-01: 05:00:00 via INTRAVENOUS

## 2018-11-01 MED ORDER — DIPHENHYDRAMINE HCL 25 MG PO CAPS: 25.0000 mg | ORAL_CAPSULE | Freq: Four times a day (QID) | ORAL | Status: DC | PRN

## 2018-11-01 MED ORDER — FENTANYL CITRATE (PF) 100 MCG/2ML IJ SOLN
INTRAMUSCULAR | Status: AC
  Filled 2018-11-01: qty 2

## 2018-11-01 MED ORDER — PRENATAL MULTIVITAMIN CH
1.0000 | ORAL_TABLET | Freq: Every day | ORAL | Status: DC
  Administered 2018-11-01 – 2018-11-03 (×3): 1 via ORAL
  Filled 2018-11-01 (×3): qty 1

## 2018-11-01 MED ORDER — MEPERIDINE HCL 25 MG/ML IJ SOLN: 6.2500 mg | INTRAMUSCULAR | Status: DC | PRN

## 2018-11-01 MED ORDER — IBUPROFEN 600 MG PO TABS: 600.0000 mg | ORAL_TABLET | Freq: Four times a day (QID) | ORAL | Status: DC

## 2018-11-01 MED ORDER — OXYCODONE HCL 5 MG PO TABS: 5.0000 mg | ORAL_TABLET | Freq: Once | ORAL | Status: DC | PRN

## 2018-11-01 MED ORDER — DEXAMETHASONE SODIUM PHOSPHATE 4 MG/ML IJ SOLN
INTRAMUSCULAR | Status: AC
  Filled 2018-11-01: qty 1

## 2018-11-01 MED ORDER — MORPHINE SULFATE (PF) 0.5 MG/ML IJ SOLN
INTRAMUSCULAR | Status: DC | PRN
  Administered 2018-11-01: 3 mg via EPIDURAL

## 2018-11-01 MED ORDER — SODIUM CHLORIDE (PF) 0.9 % IJ SOLN
INTRAMUSCULAR | Status: AC
  Filled 2018-11-01: qty 20

## 2018-11-01 MED ORDER — LIDOCAINE-EPINEPHRINE (PF) 2 %-1:200000 IJ SOLN
INTRAMUSCULAR | Status: DC | PRN
  Administered 2018-11-01 (×2): 5 mL via EPIDURAL
  Administered 2018-11-01: 3 mL via EPIDURAL
  Administered 2018-11-01: 5 mL via EPIDURAL

## 2018-11-01 MED ORDER — CEFAZOLIN SODIUM-DEXTROSE 2-3 GM-%(50ML) IV SOLR
INTRAVENOUS | Status: DC | PRN
  Administered 2018-11-01: 2 g via INTRAVENOUS

## 2018-11-01 MED ORDER — ONDANSETRON HCL 4 MG/2ML IJ SOLN
INTRAMUSCULAR | Status: AC
  Filled 2018-11-01: qty 2

## 2018-11-01 MED ORDER — LACTATED RINGERS IV SOLN
INTRAVENOUS | Status: DC
  Administered 2018-11-01 (×2): via INTRAVENOUS

## 2018-11-01 MED ORDER — DIBUCAINE 1 % RE OINT: 1.0000 "application " | TOPICAL_OINTMENT | RECTAL | Status: DC | PRN

## 2018-11-01 MED ORDER — WITCH HAZEL-GLYCERIN EX PADS: 1.0000 "application " | MEDICATED_PAD | CUTANEOUS | Status: DC | PRN

## 2018-11-01 MED ORDER — KETOROLAC TROMETHAMINE 30 MG/ML IJ SOLN: 30.0000 mg | Freq: Once | INTRAMUSCULAR | Status: DC

## 2018-11-01 MED ORDER — ONDANSETRON HCL 4 MG/2ML IJ SOLN: 4.0000 mg | Freq: Once | INTRAMUSCULAR | Status: DC | PRN

## 2018-11-01 MED ORDER — OXYTOCIN 10 UNIT/ML IJ SOLN
INTRAMUSCULAR | Status: AC
  Filled 2018-11-01: qty 4

## 2018-11-01 MED ORDER — LACTATED RINGERS IV SOLN: INTRAVENOUS | Status: DC | PRN

## 2018-11-01 MED ORDER — METHYLERGONOVINE MALEATE 0.2 MG/ML IJ SOLN: 0.2000 mg | INTRAMUSCULAR | Status: DC | PRN

## 2018-11-01 MED ORDER — SENNOSIDES-DOCUSATE SODIUM 8.6-50 MG PO TABS
2.0000 | ORAL_TABLET | ORAL | Status: DC
  Administered 2018-11-01 – 2018-11-03 (×2): 2 via ORAL
  Filled 2018-11-01 (×3): qty 2

## 2018-11-01 MED ORDER — MORPHINE SULFATE (PF) 0.5 MG/ML IJ SOLN
INTRAMUSCULAR | Status: AC
  Filled 2018-11-01: qty 10

## 2018-11-01 MED ORDER — MEPERIDINE HCL 25 MG/ML IJ SOLN
INTRAMUSCULAR | Status: DC | PRN
  Administered 2018-11-01 (×2): 12.5 mg via INTRAVENOUS

## 2018-11-01 MED ORDER — FENTANYL CITRATE (PF) 100 MCG/2ML IJ SOLN
25.0000 ug | INTRAMUSCULAR | Status: DC | PRN
  Administered 2018-11-01: 50 ug via INTRAVENOUS

## 2018-11-01 SURGICAL SUPPLY — 33 items
CHLORAPREP W/TINT 26ML (MISCELLANEOUS) ×2 IMPLANT
CLAMP CORD UMBIL (MISCELLANEOUS) IMPLANT
CLOTH BEACON ORANGE TIMEOUT ST (SAFETY) ×2 IMPLANT
DERMABOND ADVANCED (GAUZE/BANDAGES/DRESSINGS) ×1
DERMABOND ADVANCED .7 DNX12 (GAUZE/BANDAGES/DRESSINGS) ×1 IMPLANT
DRSG OPSITE POSTOP 4X10 (GAUZE/BANDAGES/DRESSINGS) ×2 IMPLANT
ELECT REM PT RETURN 9FT ADLT (ELECTROSURGICAL) ×2
ELECTRODE REM PT RTRN 9FT ADLT (ELECTROSURGICAL) ×1 IMPLANT
EXTRACTOR VACUUM M CUP 4 TUBE (SUCTIONS) IMPLANT
GLOVE BIO SURGEON STRL SZ7 (GLOVE) ×2 IMPLANT
GLOVE BIOGEL PI IND STRL 7.0 (GLOVE) ×1 IMPLANT
GLOVE BIOGEL PI INDICATOR 7.0 (GLOVE) ×1
GOWN STRL REUS W/TWL LRG LVL3 (GOWN DISPOSABLE) ×4 IMPLANT
HOVERMATT SINGLE USE (MISCELLANEOUS) ×2 IMPLANT
KIT ABG SYR 3ML LUER SLIP (SYRINGE) IMPLANT
NEEDLE HYPO 22GX1.5 SAFETY (NEEDLE) IMPLANT
NEEDLE HYPO 25X5/8 SAFETYGLIDE (NEEDLE) IMPLANT
NS IRRIG 1000ML POUR BTL (IV SOLUTION) ×2 IMPLANT
PACK C SECTION WH (CUSTOM PROCEDURE TRAY) ×2 IMPLANT
PAD OB MATERNITY 4.3X12.25 (PERSONAL CARE ITEMS) ×2 IMPLANT
PENCIL SMOKE EVAC W/HOLSTER (ELECTROSURGICAL) ×2 IMPLANT
RTRCTR C-SECT PINK 25CM LRG (MISCELLANEOUS) ×2 IMPLANT
SPONGE LAP 18X18 RF (DISPOSABLE) ×6 IMPLANT
SUT CHROMIC 1 CTX 36 (SUTURE) ×4 IMPLANT
SUT CHROMIC 2 0 CT 1 (SUTURE) ×4 IMPLANT
SUT PDS AB 0 CTX 60 (SUTURE) ×2 IMPLANT
SUT PLAIN 2 0 XLH (SUTURE) ×2 IMPLANT
SUT VIC AB 2-0 CT1 27 (SUTURE) ×1
SUT VIC AB 2-0 CT1 TAPERPNT 27 (SUTURE) ×1 IMPLANT
SUT VIC AB 4-0 KS 27 (SUTURE) IMPLANT
SYR 30ML LL (SYRINGE) IMPLANT
TOWEL OR 17X24 6PK STRL BLUE (TOWEL DISPOSABLE) ×2 IMPLANT
TRAY FOLEY W/BAG SLVR 14FR LF (SET/KITS/TRAYS/PACK) ×2 IMPLANT

## 2018-11-01 NOTE — Anesthesia Postprocedure Evaluation (Signed)
Anesthesia Post Note  Patient: Kelly Roman  Procedure(s) Performed: CESAREAN SECTION (N/A )     Patient location during evaluation: Mother Baby Anesthesia Type: Epidural Level of consciousness: awake and alert Pain management: pain level controlled Vital Signs Assessment: post-procedure vital signs reviewed and stable Respiratory status: spontaneous breathing, nonlabored ventilation and respiratory function stable Cardiovascular status: stable Postop Assessment: no headache, no backache and epidural receding Anesthetic complications: no    Last Vitals:  Vitals:   11/01/18 0657 11/01/18 0710  BP:  114/75  Pulse:  89  Resp:  18  Temp: 37.3 C 37.1 C  SpO2:  96%    Last Pain:  Vitals:   11/01/18 0710  TempSrc: Oral  PainSc:    Pain Goal:                 Lyncoln Maskell

## 2018-11-01 NOTE — Lactation Note (Signed)
This note was copied from a baby's chart. Lactation Consultation Note  Patient Name: Kelly Roman [id number] Date: 11/01/2018 Reason for consult: Initial assessment;Term;Primapara;1st time breastfeeding  P1 mother whose infant is now 61 hours old.  Mother was attempting to latch baby when I arrived.  Baby was not showing feeding cues.  I offered to assist and mother accepted.  Mother's breasts are soft and non tender and nipples are everted but short shafted.  RN had provided a #16 NS and DEBP initiated by RN.  Mother may not need to use the NS and I offered to try without it.  Showed mother proper positioning and hand placement for the cross cradle hold.  Attempted to latch baby, however, she was too sleepy and did not even open her mouth for latching.  She was passing gas and I demonstrated how to burp.  Baby burped well and attempted to latch a second time without success.  Explained to the parents that this is typical behavior for a baby at this age.  Reassured mother; she is somewhat anxious about feeding.  Basic education taught related to breast feeding and normal newborn characteristics.  Parents will benefit from continued education.  Encouraged mother to feed 8-12 times/24 hours or sooner if baby shows cues.  Reviewed feeding cues.  She was familiar with hand expression and colostrum container provided with instructions for use.  Demonstrated finger feeding.    Mom made aware of O/P services, breastfeeding support groups, community resources, and our phone # for post-discharge questions. Mother does not have a DEBP at this time and I encouraged her to call her insurance company tomorrow about obtaining a pump.  Mother will return to work in a few months.  She will call for latch assistance as needed.  Father present and supportive.  Both parents are very receptive to learning and very appreciative of help provided.   Maternal Data Formula Feeding for Exclusion: No Has patient been  taught Hand Expression?: Yes Does the patient have breastfeeding experience prior to this delivery?: No  Feeding Feeding Type: Breast Fed  LATCH Score Latch: Too sleepy or reluctant, no latch achieved, no sucking elicited.  Audible Swallowing: None  Type of Nipple: Everted at rest and after stimulation(short shafted)  Comfort (Breast/Nipple): Soft / non-tender  Hold (Positioning): Assistance needed to correctly position infant at breast and maintain latch.  LATCH Score: 5  Interventions Interventions: Breast feeding basics reviewed;Assisted with latch;Skin to skin;Breast massage;Hand express;Breast compression;Position options;Support pillows;Adjust position;DEBP  Lactation Tools Discussed/Used Tools: Pump Nipple shield size: 16(provided by RN) Breast pump type: Double-Electric Breast Pump WIC Program: No Initiated by:: Already initiated Date initiated:: 11/01/18   Consult Status Consult Status: Follow-up Date: 11/02/18 Follow-up type: In-patient    Dora Sims 11/01/2018, 6:58 PM

## 2018-11-01 NOTE — Transfer of Care (Signed)
Immediate Anesthesia Transfer of Care Note  Patient: Kelly Roman  Procedure(s) Performed: CESAREAN SECTION (N/A )  Patient Location: PACU  Anesthesia Type:Epidural  Level of Consciousness: awake, alert  and patient cooperative  Airway & Oxygen Therapy: Patient Spontanous Breathing  Post-op Assessment: Report given to RN and Post -op Vital signs reviewed and stable  Post vital signs: Reviewed and stable  Last Vitals:  Vitals Value Taken Time  BP 105/60 11/01/2018  5:45 AM  Temp 36.9 C 11/01/2018  5:45 AM  Pulse 98 11/01/2018  5:48 AM  Resp 17 11/01/2018  5:48 AM  SpO2 98 % 11/01/2018  5:48 AM  Vitals shown include unvalidated device data.  Last Pain:  Vitals:   11/01/18 0545  TempSrc:   PainSc: 0-No pain         Complications: No apparent anesthesia complications

## 2018-11-01 NOTE — Anesthesia Postprocedure Evaluation (Signed)
Anesthesia Post Note  Patient: Kelly Roman  Procedure(s) Performed: CESAREAN SECTION (N/A )     Patient location during evaluation: Mother Baby Anesthesia Type: Epidural Level of consciousness: awake and alert and oriented Pain management: satisfactory to patient Vital Signs Assessment: post-procedure vital signs reviewed and stable Respiratory status: respiratory function stable Cardiovascular status: stable Postop Assessment: no headache, no backache, epidural receding, patient able to bend at knees, no signs of nausea or vomiting and adequate PO intake Anesthetic complications: no    Last Vitals:  Vitals:   11/01/18 1100 11/01/18 1500  BP:  (!) 91/49  Pulse: (!) 102 77  Resp:  18  Temp: 37.2 C 37.1 C  SpO2: 99% 99%    Last Pain:  Vitals:   11/01/18 1500  TempSrc:   PainSc: 0-No pain   Pain Goal:                 Garv Kuechle

## 2018-11-01 NOTE — Progress Notes (Signed)
Patient ID: Kelly Roman, female   DOB: Mar 08, 1986, 33 y.o.   MRN: [id number]   Pt has now been pushing for 3 hours. Fetal vertex has not descended despite maternal pushing efforts. The majority of the skull is palpable above the pubic symphysis. Patient now with thick meconium. Given 3 hours of pushing, with no significant descent of the head the recommendation has been made to proceed with Cesarean delivery. R/B/A reviewed and the patient wishes to proceed Vitals:   11/01/18 0001 11/01/18 0030 11/01/18 0050 11/01/18 0355  BP: 110/78 120/79  (!) 140/99  Pulse: (!) 110 (!) 113  (!) 142  Resp: 16 16  19   Temp:   98.7 F (37.1 C)   TempSrc:   Oral   SpO2:      Weight:      Height:       FHR 150 + accels, cat 1 tracing cvx C/C/+2 toco Q 2  Proceed with C/S for FTD Ancef 2gm  SCDs

## 2018-11-01 NOTE — Op Note (Signed)
Pre-Operative Diagnosis: 1) 40-week intrauterine pregnancy 2) failure to descend 3) meconium-stained fluid Postoperative Diagnosis: 1) 40-week intrauterine pregnancy 2) failure to descend 3) meconium-stained fluid  Procedure: Primary low transverse cesarean section Surgeon: Dr. Waynard Reeds Assistant: None Operative Findings: Vigorous female infant in the occiput transverse presentation with Apgar  scores of 7 at 1 minute and 8 at 5 minutes.  Normal-appearing ovaries tubes and uterus.  Thick meconium stained fluid. Specimen: Placenta for disposal EBL: Total I/O In: 1700 [I.V.:1700] Out: 450 [Urine:450]   Procedure:Kelly Roman is an 33 year old gravida 1 para 0 at 40 weeks and 0 days estimated gestational age who presents for cesarean section.  Please see the patient's history, physical, and progress notes for complete details.  The patient pushed for approximately 3 hours with no significant descent of the fetal vertex.  Attempts to rotate the vertex were not successful.  Therefore the decision was made to proceed with cesarean delivery.  Following the appropriate informed consent the patient was brought to the operating room where spinal anesthesia was administered and found to be adequate. She was placed in the dorsal supine position with a leftward tilt. She was prepped and draped in the normal sterile fashion.  The patient was appropriately identified during a preoperative timeout procedure.  The scalpel was then used to make a Pfannenstiel skin incision which was carried down to the underlying layers of soft tissue to the fascia. The fascia was incised in the midline and the fascial incision was extended laterally with Mayo scissors. The superior aspect of the fascial incision was grasped with Coker clamps x2, tented up and the rectus muscles dissected off sharply with the electrocautery unit area and the same procedure was repeated on the inferior aspect of the fascial incision. The rectus muscles  were separated in the midline. The abdominal peritoneum was identified, tented up, entered sharply, and the incision was extended superiorly and inferiorly with good visualization of the bladder. The Alexis retractor was then deployed. The vesicouterine peritoneum was identified, tented up, entered sharply, and the bladder flap was created digitally. Scalpel was then used to make a low transverse incision on the uterus which was extended laterally with blunt dissection . The fetal vertex was identified, delivered easily through the uterine incision followed by the body. The infant was bulb suctioned on the operative field cried vigorously.  Following a 1 minute delay, the cord was clamped and cut and the infant was passed to the waiting neonatologist. Placenta was then delivered spontaneously, the uterus was cleared of all clot and debris. The uterine incision was repaired with #1 chromic in running locked fashion followed by a second imbricating layer. Ovaries and tubes were inspected and normal. The Alexis retractor was removed. The uterus was returned to the abdominal cavity the abdominal cavity was cleared of all clot and debris. The abdominal peritoneum was reapproximated with 2-0 Vicryl in a running fashion, the rectus muscles was reapproximated with 2-0 chromic in a running fashion. The fascia was closed with a looped PDS in a running fashion. The skin was closed with 4-0 vicryl in a subcuticular fashion and Dermabond. All sponge lap and needle counts were correct. Patient tolerated the procedure well and recovered in stable condition following the procedure.

## 2018-11-02 LAB — CBC
HCT: 27 % — ABNORMAL LOW (ref 36.0–46.0)
Hemoglobin: 9.2 g/dL — ABNORMAL LOW (ref 12.0–15.0)
MCH: 30.9 pg (ref 26.0–34.0)
MCHC: 34.1 g/dL (ref 30.0–36.0)
MCV: 90.6 fL (ref 80.0–100.0)
Platelets: 134 10*3/uL — ABNORMAL LOW (ref 150–400)
RBC: 2.98 MIL/uL — AB (ref 3.87–5.11)
RDW: 13.2 % (ref 11.5–15.5)
WBC: 19.7 10*3/uL — ABNORMAL HIGH (ref 4.0–10.5)
nRBC: 0 % (ref 0.0–0.2)

## 2018-11-02 MED ORDER — IBUPROFEN 600 MG PO TABS
600.0000 mg | ORAL_TABLET | Freq: Four times a day (QID) | ORAL | Status: DC
  Administered 2018-11-02 – 2018-11-03 (×4): 600 mg via ORAL
  Filled 2018-11-02 (×4): qty 1

## 2018-11-02 MED ORDER — ACETAMINOPHEN 500 MG PO TABS
1000.0000 mg | ORAL_TABLET | Freq: Four times a day (QID) | ORAL | Status: DC
  Administered 2018-11-02 – 2018-11-03 (×4): 1000 mg via ORAL
  Filled 2018-11-02 (×4): qty 2

## 2018-11-02 NOTE — Lactation Note (Signed)
This note was copied from a baby's chart. Lactation Consultation Note  Patient Name: Kelly Roman Date: 11/02/2018 Reason for consult: Follow-up assessment;Term;Primapara;1st time breastfeeding;Other (Comment)(RN request due to baby not feeding )  Baby is 33 hours pld  LC reviewed and updated the doc flow sheets / has been to the breast with #16 NS ( provided by the RN )  3 wets and 1 stool.  As LC entered the room/ baby awake / mom pumping with DEBP / with EBM yield.  LC offered to assist with latch/ and resized mom for Nipple Shield / #20 NS to small after mom pumped.  #24 NS fit well / and LC instilled EBM in the NS and baby fed for 10 mins and off few seconds and instilled the  Rest of the EBM in the top/ and baby re-latched for 5 more minutes, total feeding for 15 mins.  Baby fell asleep after the feeding and LC placed STS on moms chest.  After the feeding resized mom for the NS again - and noted the nipple wasn't has big and the #20 NS fit well .  Baby did accommodate the #24 NS fairly well/ noted side of moms nipple slightly.   LC Plan :  LC instructed mom on the use of shells between feedings except when sleeping to enhance the compressibility of the Nipple areola complex.  LC recommended using the #20 NS / instill the EBM in the top and with firm support latch.( football worked well )  After feeding pump both breast for 15 -20 mins both breast / save milk for next feeding.  STS feedings - feed with feeding cues, ( 8-12 times in 24 hours ) since the baby is older and has been sluggish with  Feedings by 2 1/2 - 3 hours , attempt to feed.   MBURN Heather Gaither aware of the findings at consult / and she was giving report to [employer].    Maternal Data Has patient been taught Hand Expression?: Yes  Feeding Feeding Type: Breast Milk  LATCH Score Latch: Grasps breast easily, tongue down, lips flanged, rhythmical sucking.  Audible Swallowing:  Spontaneous and intermittent  Type of Nipple: Everted at rest and after stimulation  Comfort (Breast/Nipple): Soft / non-tender  Hold (Positioning): Assistance needed to correctly position infant at breast and maintain latch.  LATCH Score: 9  Interventions Interventions: Breast feeding basics reviewed;Assisted with latch;Skin to skin;Breast massage;Hand express;Breast compression;Adjust position;Support pillows;Position options;Shells;DEBP  Lactation Tools Discussed/Used Tools: Shells;Pump;Nipple Dorris Carnes;Flanges Nipple shield size: 20;24;Other (comment)(#20 NS to small after pumping - #24 NS a better fit / after feeding #20 NS a better fit ) Flange Size: 24;Other (comment);21(per mom the #21 was hurting - #24 F appeared comfortable ) Shell Type: Inverted Breast pump type: Double-Electric Breast Pump(already set up ) Pump Review: Setup, frequency, and cleaning;Milk Storage(mom aware to stop the #21 flange - per mom  it was hurting ) Initiated by:: LC reviewed and assisted dad to clean pump pieces 1/13    Consult Status Consult Status: Follow-up Date: 11/03/18 Follow-up type: In-patient    Kelly Roman Kelly Roman 11/02/2018, 2:49 PM

## 2018-11-02 NOTE — Progress Notes (Signed)
Patient is doing well.  She is tolerating PO, ambulating, voiding.  Pain is controlled.  Lochia is appropriate  Vitals:   11/01/18 1500 11/01/18 1901 11/01/18 2100 11/02/18 0547  BP: (!) 91/49 (!) 95/55 98/63 (!) 100/57  Pulse: 77 (!) 102 (!) 102 (!) 104  Resp: 18  17 18   Temp: 98.8 F (37.1 C) 98.3 F (36.8 C) 98.2 F (36.8 C) 98.1 F (36.7 C)  TempSrc:   Oral   SpO2: 99% 99% 98% 100%  Weight:      Height:        NAD Abdomen:  soft, appropriate tenderness, incisions intact and without erythema or drainage ext:    Symmetric, 1+ edema bilaterally  Lab Results  Component Value Date   WBC 19.7 (H) 11/02/2018   HGB 9.2 (L) 11/02/2018   HCT 27.0 (L) 11/02/2018   MCV 90.6 11/02/2018   PLT 134 (L) 11/02/2018    --/--/O POS, O POS Performed at [facility], [address] [place], [place] [address]  (01/11 0751)/RImmune  A/P    33 y.o. G1P1001 POD 1 s/p primary c/s for arrest of descent Routine post op and postpartum care.   Doing well

## 2018-11-03 MED ORDER — OXYCODONE-ACETAMINOPHEN 5-325 MG PO TABS: 1.0000 | ORAL_TABLET | ORAL | 0 refills | Status: AC | PRN

## 2018-11-03 NOTE — Progress Notes (Signed)
  Patient is eating, ambulating, voiding.  Pain control is good.  Vitals:   11/02/18 0940 11/02/18 1440 11/02/18 2246 11/03/18 0646  BP: 100/64 104/70 116/70 108/63  Pulse: 87 (!) 109 90 83  Resp: 18 18 17    Temp: 97.7 F (36.5 C) 98.3 F (36.8 C) 97.9 F (36.6 C) 98 F (36.7 C)  TempSrc: Oral Oral Oral Oral  SpO2: 98% 90%  98%  Weight:      Height:        lungs:   clear to auscultation cor:    RRR Abdomen:  soft, appropriate tenderness, incisions intact and without erythema or exudate ex:    no cords   Lab Results  Component Value Date   WBC 19.7 (H) 11/02/2018   HGB 9.2 (L) 11/02/2018   HCT 27.0 (L) 11/02/2018   MCV 90.6 11/02/2018   PLT 134 (L) 11/02/2018    --/--/O POS, O POS Performed at [facility], [address] [place], [place] [address]  (01/11 0751)/RI  A/P    Post operative day 2.  Routine post op and postpartum care.  Expect d/c today.  Percocet for pain control. Iron.

## 2018-11-03 NOTE — Lactation Note (Signed)
This note was copied from a baby's chart. Lactation Consultation Note  Patient Name: Kelly Roman [id number] Date: 11/03/2018   Provided family with [facility] loaner.     Maternal Data    Feeding    LATCH Score                   Interventions    Lactation Tools Discussed/Used     Consult Status      Dahlia Byes [facility] 11/03/2018, 5:04 PM

## 2018-11-03 NOTE — Lactation Note (Signed)
This note was copied from a baby's chart. Lactation Consultation Note  Patient Name: Kelly Roman Kelly Roman Date: 11/03/2018 Reason for consult: Follow-up assessment;Term;Primapara;1st time breastfeeding;Difficult latch;Other (Comment)(using a NS for latch 6% weight loss )  Baby is 77 hours old  Per mom baby recently fed at 1030 am with #24 NS with EBM. Both mom and dad feel breast feeding is going  Well since yesterday with the use of the NS.  Per mom has pumped with the DEBP after 4-5 feedings. With volume upto 5 ml.  Mom mentioned her nipples are sore / LC offered to assess and mom receptive.  LC noted boarder line engorged breast bilaterally, semi compressible areola on the right  And full, both nipples have intact abrasions. LC recommended using her EBM to nipples liberally,  Comfort gels after feedings or pumping / and when in the refrig, switch to the shells except when feeding,  Pumping or sleeping. LC provided and assisted mom in position to ice for 15 -20 mins and instructed her after pumping  To either pump with hand pump or the DEBP prior to D/C .  Sore  Nipple and engorgement prevention and tx reviewed.  LC instructed mom on the use hand pump and has already been instructed on the DEBP kit.  Per mom active with Kelly Roman . LC had the Kelly Roman / Kelly see mom and sign the baby up/ also  [employer] faxed a form to Kelly Roman requesting a DEBP today / and left a message in need of a DEBP today,  Giving patients phone number and [place] .  Mother informed of post-discharge support and given phone number to the lactation department, including services for phone call assistance; out-patient appointments; and breastfeeding support group. List of other breastfeeding resources in the community given in the handout. Encouraged mother to call for problems or concerns related to breastfeeding.  Both mom and dad willing to come back for Kelly Roman O/P appt this Friday or next Monday or Tuesday .  LC requested in the Epic  basket for the Kelly Roman clinic to call mom.   MBURN Kelly Roman aware mom is icing / and need to pump prior to feeding.   Maternal Data Has patient been taught Hand Expression?: Yes  Feeding Feeding Type: (pe rmom baby last fed at 10:30 )  LATCH Score ( latch Score prior to this [facility] consult  Latch: Grasps breast easily, tongue down, lips flanged, rhythmical sucking.  Audible Swallowing: Spontaneous and intermittent(EBM in NS)  Type of Nipple: Flat  Comfort (Breast/Nipple): Soft / non-tender  Hold (Positioning): No assistance needed to correctly position infant at breast.(only FOB assisted )  LATCH Score: 9  Interventions Interventions: Breast feeding basics reviewed;Comfort gels;Shells;Hand pump;DEBP  Lactation Tools Discussed/Used Tools: Pump;Shells;Comfort gels;Nipple [place] Fuel;Flanges Nipple shield size: 20;24 Flange Size: 24;27 Shell Type: Inverted Breast pump type: Double-Electric Breast Pump;Manual Kelly Roman Program: Yes(per mom active with Kelly Kelly Roman ) Pump Review: Setup, frequency, and cleaning;Milk Storage Initiated by:: MAI  Date initiated:: 11/03/18   Consult Status Consult Status: Follow-up Date: (Kelly Roman offered to request and Lc O/P appt at [facility] and both mom and dad receptive ) Follow-up type: Out-patient(LC placed a request for appt this Friday or next Monday or Tuesday in [facility] clinic epic basket )    Kelly Roman 11/03/2018, 12:18 PM

## 2018-11-03 NOTE — Discharge Summary (Signed)
Obstetric Discharge Summary Reason for Admission: onset of labor Prenatal Procedures: NST Intrapartum Procedures: cesarean: low cervical, transverse Postpartum Procedures: none Complications-Operative and Postpartum: none Hemoglobin  Date Value Ref Range Status  11/02/2018 9.2 (L) 12.0 - 15.0 g/dL Final    Comment:    DELTA CHECK NOTED REPEATED TO VERIFY    HCT  Date Value Ref Range Status  11/02/2018 27.0 (L) 36.0 - 46.0 % Final    Discharge Diagnoses: Term Pregnancy-delivered  Discharge Information: Date: 11/03/2018 Activity: pelvic rest Diet: routine Medications: Ibuprofen and Iron Condition: stable Instructions: refer to practice specific booklet Discharge to: home Follow-up Information    Waynard Reedsoss, Kendra, MD Follow up in 4 week(s).   Specialty:  Obstetrics and Gynecology Contact information: [address] [place] [address] [phone]           Newborn Data: Live born female  Birth Weight: 7 lb 8.1 oz (3405 g) APGAR: 7, 8  Newborn Delivery   Birth date/time:  11/01/2018 04:56:00 Delivery type:  C-Section, Low Transverse Trial of labor:  No C-section categorization:  Primary     Home with mother.  Loney LaurenceMichelle A Loralai Eisman 11/03/2018, 7:34 AM

## 2018-11-05 ENCOUNTER — Encounter (HOSPITAL_COMMUNITY): Payer: Self-pay

## 2018-11-05 ENCOUNTER — Other Ambulatory Visit: Payer: Self-pay

## 2018-11-05 ENCOUNTER — Inpatient Hospital Stay (HOSPITAL_COMMUNITY)
Admission: AD | Admit: 2018-11-05 | Discharge: 2018-11-05 | Disposition: A | Discharge status: Home / Self Care | Payer: Medicaid Other | Attending: Obstetrics and Gynecology | Admitting: Obstetrics and Gynecology

## 2018-11-05 DIAGNOSIS — E860 Dehydration: Secondary | ICD-10-CM

## 2018-11-05 DIAGNOSIS — R000 Tachycardia, unspecified: Secondary | ICD-10-CM | POA: Insufficient documentation

## 2018-11-05 DIAGNOSIS — M545 Low back pain: Secondary | ICD-10-CM | POA: Diagnosis present

## 2018-11-05 DIAGNOSIS — O9089 Other complications of the puerperium, not elsewhere classified: Secondary | ICD-10-CM | POA: Diagnosis not present

## 2018-11-05 DIAGNOSIS — N3001 Acute cystitis with hematuria: Secondary | ICD-10-CM | POA: Diagnosis not present

## 2018-11-05 DIAGNOSIS — O8620 Urinary tract infection following delivery, unspecified: Secondary | ICD-10-CM | POA: Insufficient documentation

## 2018-11-05 DIAGNOSIS — O9279 Other disorders of lactation: Secondary | ICD-10-CM

## 2018-11-05 LAB — CBC WITH DIFFERENTIAL/PLATELET
Basophils Absolute: 0 10*3/uL (ref 0.0–0.1)
Basophils Relative: 0 %
Eosinophils Absolute: 0.1 10*3/uL (ref 0.0–0.5)
Eosinophils Relative: 1 %
HCT: 31.1 % — ABNORMAL LOW (ref 36.0–46.0)
HEMOGLOBIN: 10.5 g/dL — AB (ref 12.0–15.0)
Lymphocytes Relative: 14 %
Lymphs Abs: 2.5 10*3/uL (ref 0.7–4.0)
MCH: 30.6 pg (ref 26.0–34.0)
MCHC: 33.8 g/dL (ref 30.0–36.0)
MCV: 90.7 fL (ref 80.0–100.0)
MONOS PCT: 4 %
Monocytes Absolute: 0.8 10*3/uL (ref 0.1–1.0)
NEUTROS PCT: 81 %
Neutro Abs: 14.9 10*3/uL — ABNORMAL HIGH (ref 1.7–7.7)
Platelets: 254 10*3/uL (ref 150–400)
RBC: 3.43 MIL/uL — ABNORMAL LOW (ref 3.87–5.11)
RDW: 13 % (ref 11.5–15.5)
WBC: 18.3 10*3/uL — ABNORMAL HIGH (ref 4.0–10.5)
nRBC: 0 % (ref 0.0–0.2)

## 2018-11-05 LAB — COMPREHENSIVE METABOLIC PANEL
ALBUMIN: 2.9 g/dL — AB (ref 3.5–5.0)
ALT: 24 U/L (ref 0–44)
ANION GAP: 11 (ref 5–15)
AST: 33 U/L (ref 15–41)
Alkaline Phosphatase: 93 U/L (ref 38–126)
BUN: 15 mg/dL (ref 6–20)
CO2: 20 mmol/L — ABNORMAL LOW (ref 22–32)
Calcium: 8.7 mg/dL — ABNORMAL LOW (ref 8.9–10.3)
Chloride: 102 mmol/L (ref 98–111)
Creatinine, Ser: 0.58 mg/dL (ref 0.44–1.00)
GFR calc Af Amer: >60 mL/min (ref >60)
GFR calc non Af Amer: >60 mL/min (ref >60)
Glucose, Bld: 122 mg/dL — ABNORMAL HIGH (ref 70–99)
POTASSIUM: 3.5 mmol/L (ref 3.5–5.1)
Sodium: 133 mmol/L — ABNORMAL LOW (ref 135–145)
Total Bilirubin: 0.9 mg/dL (ref 0.3–1.2)
Total Protein: 6.4 g/dL — ABNORMAL LOW (ref 6.5–8.1)

## 2018-11-05 LAB — URINALYSIS, ROUTINE W REFLEX MICROSCOPIC
Bilirubin Urine: NEGATIVE
Glucose, UA: NEGATIVE mg/dL
Ketones, ur: NEGATIVE mg/dL
Nitrite: NEGATIVE
Protein, ur: 30 mg/dL — AB
RBC / HPF: >50 RBC/hpf — ABNORMAL HIGH (ref 0–5)
Specific Gravity, Urine: 1.009 (ref 1.005–1.030)
WBC, UA: >50 WBC/hpf — ABNORMAL HIGH (ref 0–5)
pH: 7 (ref 5.0–8.0)

## 2018-11-05 LAB — TYPE AND SCREEN
ABO/RH(D): O POS
Antibody Screen: NEGATIVE

## 2018-11-05 MED ORDER — CEPHALEXIN 500 MG PO CAPS: 500.0000 mg | ORAL_CAPSULE | Freq: Four times a day (QID) | ORAL | 0 refills | Status: AC

## 2018-11-05 MED ORDER — IBUPROFEN 600 MG PO TABS
600.0000 mg | ORAL_TABLET | Freq: Once | ORAL | Status: AC
  Administered 2018-11-05: 600 mg via ORAL
  Filled 2018-11-05: qty 1

## 2018-11-05 MED ORDER — LACTATED RINGERS IV SOLN
INTRAVENOUS | Status: DC
  Administered 2018-11-05 (×2): via INTRAVENOUS

## 2018-11-05 MED ORDER — IBUPROFEN 600 MG PO TABS: 600.0000 mg | ORAL_TABLET | Freq: Four times a day (QID) | ORAL | 1 refills | Status: AC | PRN

## 2018-11-05 NOTE — Discharge Instructions (Signed)
Cesarean Delivery, Care After This sheet gives you information about how to care for yourself after your procedure. Your health care provider may also give you more specific instructions. If you have problems or questions, contact your health care provider. What can I expect after the procedure? After the procedure, it is common to have:  A small amount of blood or clear fluid coming from the incision.  Some redness, swelling, and pain in your incision area.  Some abdominal pain and soreness.  Vaginal bleeding (lochia). Even though you did not have a vaginal delivery, you will still have vaginal bleeding and discharge.  Pelvic cramps.  Fatigue. You may have pain, swelling, and discomfort in the tissue between your vagina and your anus (perineum) if:  Your C-section was unplanned, and you were allowed to labor and push.  An incision was made in the area (episiotomy) or the tissue tore during attempted vaginal delivery. Follow these instructions at home: Incision care   Follow instructions from your health care provider about how to take care of your incision. Make sure you: ? Wash your hands with soap and water before you change your bandage (dressing). If soap and water are not available, use hand sanitizer. ? If you have a dressing, change it or remove it as told by your health care provider. ? Leave stitches (sutures), skin staples, skin glue, or adhesive strips in place. These skin closures may need to stay in place for 2 weeks or longer. If adhesive strip edges start to loosen and curl up, you may trim the loose edges. Do not remove adhesive strips completely unless your health care provider tells you to do that.  Check your incision area every day for signs of infection. Check for: ? More redness, swelling, or pain. ? More fluid or blood. ? Warmth. ? Pus or a bad smell.  Do not take baths, swim, or use a hot tub until your health care provider says it's okay. Ask your health  care provider if you can take showers.  When you cough or sneeze, hug a pillow. This helps with pain and decreases the chance of your incision opening up (dehiscing). Do this until your incision heals. Medicines  Take over-the-counter and prescription medicines only as told by your health care provider.  If you were prescribed an antibiotic medicine, take it as told by your health care provider. Do not stop taking the antibiotic even if you start to feel better.  Do not drive or use heavy machinery while taking prescription pain medicine. Lifestyle  Do not drink alcohol. This is especially important if you are breastfeeding or taking pain medicine.  Do not use any products that contain nicotine or tobacco, such as cigarettes, e-cigarettes, and chewing tobacco. If you need help quitting, ask your health care provider. Eating and drinking  Drink at least 8 eight-ounce glasses of water every day unless told not to by your health care provider. If you breastfeed, you may need to drink even more water.  Eat high-fiber foods every day. These foods may help prevent or relieve constipation. High-fiber foods include: ? Whole grain cereals and breads. ? Brown rice. ? Beans. ? Fresh fruits and vegetables. Activity   If possible, have someone help you care for your baby and help with household activities for at least a few days after you leave the hospital.  Return to your normal activities as told by your health care provider. Ask your health care provider what activities are safe for  you.  Rest as much as possible. Try to rest or take a nap while your baby is sleeping.  Do not lift anything that is heavier than 10 lbs (4.5 kg), or the limit that you were told, until your health care provider says that it is safe.  Talk with your health care provider about when you can engage in sexual activity. This may depend on your: ? Risk of infection. ? How fast you heal. ? Comfort and desire to  engage in sexual activity. General instructions  Do not use tampons or douches until your health care provider approves.  Wear loose, comfortable clothing and a supportive and well-fitting bra.  Keep your perineum clean and dry. Wipe from front to back when you use the toilet.  If you pass a blood clot, save it and call your health care provider to discuss. Do not flush blood clots down the toilet before you get instructions from your health care provider.  Keep all follow-up visits for you and your baby as told by your health care provider. This is important. Contact a health care provider if:  You have: ? A fever. ? Bad-smelling vaginal discharge. ? Pus or a bad smell coming from your incision. ? Difficulty or pain when urinating. ? A sudden increase or decrease in the frequency of your bowel movements. ? More redness, swelling, or pain around your incision. ? More fluid or blood coming from your incision. ? A rash. ? Nausea. ? Little or no interest in activities you used to enjoy. ? Questions about caring for yourself or your baby.  Your incision feels warm to the touch.  Your breasts turn red or become painful or hard.  You feel unusually sad or worried.  You vomit.  You pass a blood clot from your vagina.  You urinate more than usual.  You are dizzy or light-headed. Get help right away if:  You have: ? Pain that does not go away or get better with medicine. ? Chest pain. ? Difficulty breathing. ? Blurred vision or spots in your vision. ? Thoughts about hurting yourself or your baby. ? New pain in your abdomen or in one of your legs. ? A severe headache.  You faint.  You bleed from your vagina so much that you fill more than one sanitary pad in one hour. Bleeding should not be heavier than your heaviest period. Summary  After the procedure, it is common to have pain at your incision site, abdominal cramping, and slight bleeding from your vagina.  Check  your incision area every day for signs of infection.  Tell your health care provider about any unusual symptoms.  Keep all follow-up visits for you and your baby as told by your health care provider. This information is not intended to replace advice given to you by your health care provider. Make sure you discuss any questions you have with your health care provider. Document Released: 06/29/2002 Document Revised: 04/15/2018 Document Reviewed: 04/15/2018 Elsevier Interactive Patient Education  2019 [employer] Inc. Urinary Tract Infection, Adult  A urinary tract infection (UTI) is an infection of any part of the urinary tract. The urinary tract includes the kidneys, ureters, bladder, and urethra. These organs make, store, and get rid of urine in the body. Your health care provider may use other names to describe the infection. An upper UTI affects the ureters and kidneys (pyelonephritis). A lower UTI affects the bladder (cystitis) and urethra (urethritis). What are the causes? Most urinary tract infections  are caused by bacteria in your genital area, around the entrance to your urinary tract (urethra). These bacteria grow and cause inflammation of your urinary tract. What increases the risk? You are more likely to develop this condition if:  You have a urinary catheter that stays in place (indwelling).  You are not able to control when you urinate or have a bowel movement (you have incontinence).  You are female and you: ? Use a spermicide or diaphragm for birth control. ? Have low estrogen levels. ? Are pregnant.  You have certain genes that increase your risk (genetics).  You are sexually active.  You take antibiotic medicines.  You have a condition that causes your flow of urine to slow down, such as: ? An enlarged prostate, if you are female. ? Blockage in your urethra (stricture). ? A kidney stone. ? A nerve condition that affects your bladder control (neurogenic  bladder). ? Not getting enough to drink, or not urinating often.  You have certain medical conditions, such as: ? Diabetes. ? A weak disease-fighting system (immunesystem). ? Sickle cell disease. ? Gout. ? Spinal cord injury. What are the signs or symptoms? Symptoms of this condition include:  Needing to urinate right away (urgently).  Frequent urination or passing small amounts of urine frequently.  Pain or burning with urination.  Blood in the urine.  Urine that smells bad or unusual.  Trouble urinating.  Cloudy urine.  Vaginal discharge, if you are female.  Pain in the abdomen or the lower back. You may also have:  Vomiting or a decreased appetite.  Confusion.  Irritability or tiredness.  A fever.  Diarrhea. The first symptom in older adults may be confusion. In some cases, they may not have any symptoms until the infection has worsened. How is this diagnosed? This condition is diagnosed based on your medical history and a physical exam. You may also have other tests, including:  Urine tests.  Blood tests.  Tests for sexually transmitted infections (STIs). If you have had more than one UTI, a cystoscopy or imaging studies may be done to determine the cause of the infections. How is this treated? Treatment for this condition includes:  Antibiotic medicine.  Over-the-counter medicines to treat discomfort.  Drinking enough water to stay hydrated. If you have frequent infections or have other conditions such as a kidney stone, you may need to see a health care provider who specializes in the urinary tract (urologist). In rare cases, urinary tract infections can cause sepsis. Sepsis is a life-threatening condition that occurs when the body responds to an infection. Sepsis is treated in the hospital with IV antibiotics, fluids, and other medicines. Follow these instructions at home:  Medicines  Take over-the-counter and prescription medicines only as told  by your health care provider.  If you were prescribed an antibiotic medicine, take it as told by your health care provider. Do not stop using the antibiotic even if you start to feel better. General instructions  Make sure you: ? Empty your bladder often and completely. Do not hold urine for long periods of time. ? Empty your bladder after sex. ? Wipe from front to back after a bowel movement if you are female. Use each tissue one time when you wipe.  Drink enough fluid to keep your urine pale yellow.  Keep all follow-up visits as told by your health care provider. This is important. Contact a health care provider if:  Your symptoms do not get better after 1-2 days.  Your symptoms go away and then return. Get help right away if you have:  Severe pain in your back or your lower abdomen.  A fever.  Nausea or vomiting. Summary  A urinary tract infection (UTI) is an infection of any part of the urinary tract, which includes the kidneys, ureters, bladder, and urethra.  Most urinary tract infections are caused by bacteria in your genital area, around the entrance to your urinary tract (urethra).  Treatment for this condition often includes antibiotic medicines.  If you were prescribed an antibiotic medicine, take it as told by your health care provider. Do not stop using the antibiotic even if you start to feel better.  Keep all follow-up visits as told by your health care provider. This is important. This information is not intended to replace advice given to you by your health care provider. Make sure you discuss any questions you have with your health care provider. Document Released: 07/17/2005 Document Revised: 04/16/2018 Document Reviewed: 04/16/2018 Elsevier Interactive Patient Education  2019 [employer]

## 2018-11-05 NOTE — MAU Note (Signed)
Pt s/p c/s 11/01/2018 here with complaints of feeling abdominal pain and back pain that feel like contractions. States the pain woke her out of her sleep and that her "belly was vibrating." Pt states she had to take her newborn to the pediatrician this morning and is unsure if the walking made her pain worse. States she last took oxycodone before she came in. Does not currently have pain. Reports a small amount of vaginal bleeding that has lessened over the past few days. Pt also reports swelling in her feet.

## 2018-11-05 NOTE — MAU Provider Note (Signed)
Chief Complaint:  Postpartum Complications; Back Pain; and feet swelling   First Provider Initiated Contact with Patient 11/05/18 0215      HPI: Kelly Roman is a 33 y.o. G1P1001 who presents to maternity admissions reporting abdominal cramping and low back pain.  Felt tightness of her uterus when this happened.  Also had back pain that started "when my husband drove on the bumpy road"  Spent the day very active with appointments and very little rest.  Only drank 4 bottles of water all day. No back pain prior to that.  Only taking oxycodone, not taking any ibuprofen.  Bleeding is small. Also c/o leg swelling.  Breastfeeding is going well, milk came in today.. She reports vaginal bleeding, but no vaginal itching/burning, urinary symptoms, h/a, dizziness, n/v, or fever/chills.    C/S on 11/01/2018 for failure to descend.    Back Pain  This is a new problem. The current episode started today. The problem occurs intermittently. The pain is present in the lumbar spine. The quality of the pain is described as cramping. The pain does not radiate. Associated symptoms include abdominal pain. Pertinent negatives include no chest pain, dysuria, fever, headaches, leg pain, numbness, paresis, paresthesias, tingling or weakness.   RN note: Pt s/p c/s 11/01/2018 here with complaints of feeling abdominal pain and back pain that feel like contractions. States the pain woke her out of her sleep and that her "belly was vibrating." Pt states she had to take her newborn to the pediatrician this morning and is unsure if the walking made her pain worse. States she last took oxycodone before she came in. Does not currently have pain. Reports a small amount of vaginal bleeding that has lessened over the past few days. Pt also reports swelling in her feet.   Past Medical History: Past Medical History:  Diagnosis Date  . Chicken pox   . Medical history non-contributory   . Spinal dysraphism (HCC)    subtle,  non-closure of lower vertabrae    Past obstetric history: OB History  Gravida Para Term Preterm AB Living  1 1 1     1   SAB TAB Ectopic Multiple Live Births        0 1    # Outcome Date GA Lbr Len/2nd Weight Sex Delivery Anes PTL Lv  1 Term 11/01/18 [id number] 08:17 / 06:48 3405 g F CS-LTranv EPI  LIV    Past Surgical History: Past Surgical History:  Procedure Laterality Date  . BREAST BIOPSY  2007   Right side   . BREAST SURGERY Right 2007   cyst removed   . CESAREAN SECTION N/A 11/01/2018   Procedure: CESAREAN SECTION;  Surgeon: Waynard Reedsoss, Kendra, MD;  [facility];  Service: Obstetrics;  Laterality: N/A;    Family History: Family History  Problem Relation Age of Onset  . Other Mother     Social History: Social History   Tobacco Use  . Smoking status: Never Smoker  . Smokeless tobacco: Never Used  Substance Use Topics  . Alcohol use: No  . Drug use: No    Allergies: No Known Allergies  Meds:  Medications Prior to Admission  Medication Sig Dispense Refill Last Dose  . oxyCODONE-acetaminophen (PERCOCET/ROXICET) 5-325 MG tablet Take 1-2 tablets by mouth every 4 (four) hours as needed for moderate pain. 30 tablet 0 11/04/2018 at 1800  . Prenatal Vit-Fe Fumarate-FA (PRENATAL MULTIVITAMIN) TABS tablet Take 1 tablet by mouth daily at 12 noon.   11/04/2018 at  Unknown time    I have reviewed patient's Past Medical Hx, Surgical Hx, Family Hx, Social Hx, medications and allergies.  ROS:  Review of Systems  Constitutional: Negative for fever.  Cardiovascular: Negative for chest pain.  Gastrointestinal: Positive for abdominal pain.  Genitourinary: Negative for dysuria.  Musculoskeletal: Positive for back pain.  Neurological: Negative for tingling, weakness, numbness, headaches and paresthesias.   Other systems negative     Physical Exam   Patient Vitals for the past 24 hrs:  BP Temp Temp src Pulse Resp SpO2 Height Weight  11/05/18 0204 (!) 142/76 98.9 F  (37.2 C) Oral (!) 141 18 98 % - -  11/05/18 0153 - - - - - - 5\' 2"  (1.575 m) 93 kg   Heart rate initially 140s-150 on admission.  EKG done showed sinus tachycardia.  HR came down as patient relaxed (pt stated she was very anxious at first).  HR came down as we hydrated her with fluids also.  Constitutional: Well-developed, well-nourished female in no acute distress.  Cardiovascular: elevated rate and regular rhythm, no ectopy audible, S1 & S2 heard, no murmur Respiratory: normal effort, no distress. Lungs CTAB with no wheezes or crackles GI: Abd soft, non-tender, except for mild tenderness over incision. Uterus is not tender at fundus.   Dressing is clean and intact.  No erethema.  Nondistended.  No rebound, No guarding.  Bowel Sounds audible  MS: Extremities nontender, 1+ edema, normal ROM Neurologic: Alert and oriented x 4.   Grossly nonfocal. GU: Neg CVAT. Skin:  Warm and Dry Psych:  Affect appropriate.  PELVIC EXAM: deferred.  Lochia is small rubra.  No clots, no hemorrhage   Labs: Results for orders placed or performed during the hospital encounter of 11/05/18 (from the past 24 hour(s))  Urinalysis, Routine w reflex microscopic     Status: Abnormal   Collection Time: 11/05/18  2:10 AM  Result Value Ref Range   Color, Urine YELLOW YELLOW   APPearance HAZY (A) CLEAR   Specific Gravity, Urine 1.009 1.005 - 1.030   pH 7.0 5.0 - 8.0   Glucose, UA NEGATIVE NEGATIVE mg/dL   Hgb urine dipstick LARGE (A) NEGATIVE   Bilirubin Urine NEGATIVE NEGATIVE   Ketones, ur NEGATIVE NEGATIVE mg/dL   Protein, ur 30 (A) NEGATIVE mg/dL   Nitrite NEGATIVE NEGATIVE   Leukocytes, UA LARGE (A) NEGATIVE   RBC / HPF >50 (H) 0 - 5 RBC/hpf   WBC, UA >50 (H) 0 - 5 WBC/hpf   Bacteria, UA MANY (A) NONE SEEN   Squamous Epithelial / LPF 0-5 0 - 5   Mucus PRESENT    Non Squamous Epithelial 6-10 (A) NONE SEEN  CBC with Differential/Platelet     Status: Abnormal   Collection Time: 11/05/18  2:28 AM  Result  Value Ref Range   WBC 18.3 (H) 4.0 - 10.5 K/uL   RBC 3.43 (L) 3.87 - 5.11 MIL/uL   Hemoglobin 10.5 (L) 12.0 - 15.0 g/dL   HCT 11.9 (L) 14.7 - 82.9 %   MCV 90.7 80.0 - 100.0 fL   MCH 30.6 26.0 - 34.0 pg   MCHC 33.8 30.0 - 36.0 g/dL   RDW 56.2 13.0 - 86.5 %   Platelets 254 150 - 400 K/uL   nRBC 0.0 0.0 - 0.2 %   Neutrophils Relative % 81 %   Neutro Abs 14.9 (H) 1.7 - 7.7 K/uL   Lymphocytes Relative 14 %   Lymphs Abs 2.5 0.7 - 4.0 K/uL  Monocytes Relative 4 %   Monocytes Absolute 0.8 0.1 - 1.0 K/uL   Eosinophils Relative 1 %   Eosinophils Absolute 0.1 0.0 - 0.5 K/uL   Basophils Relative 0 %   Basophils Absolute 0.0 0.0 - 0.1 K/uL  Type and screen     Status: None   Collection Time: 11/05/18  2:28 AM  Result Value Ref Range   ABO/RH(D) O POS    Antibody Screen NEG    Sample Expiration      11/08/2018 Performed at [facility], [address] [place], [place] [address]   Comprehensive metabolic panel     Status: Abnormal   Collection Time: 11/05/18  2:28 AM  Result Value Ref Range   Sodium 133 (L) 135 - 145 mmol/L   Potassium 3.5 3.5 - 5.1 mmol/L   Chloride 102 98 - 111 mmol/L   CO2 20 (L) 22 - 32 mmol/L   Glucose, Bld 122 (H) 70 - 99 mg/dL   BUN 15 6 - 20 mg/dL   Creatinine, Ser 6.040.58 0.44 - 1.00 mg/dL   Calcium 8.7 (L) 8.9 - 10.3 mg/dL   Total Protein 6.4 (L) 6.5 - 8.1 g/dL   Albumin 2.9 (L) 3.5 - 5.0 g/dL   AST 33 15 - 41 U/L   ALT 24 0 - 44 U/L   Alkaline Phosphatase 93 38 - 126 U/L   Total Bilirubin 0.9 0.3 - 1.2 mg/dL   GFR calc non Af Amer >60 >60 mL/min   GFR calc Af Amer >60 >60 mL/min   Anion gap 11 5 - 15   Prior to discharge:   Ref. Range 11/02/2018 06:37  WBC Latest Ref Range: 4.0 - 10.5 K/uL 19.7 (H)  RBC Latest Ref Range: 3.87 - 5.11 MIL/uL 2.98 (L)  Hemoglobin Latest Ref Range: 12.0 - 15.0 g/dL 9.2 (L)  HCT Latest Ref Range: 36.0 - 46.0 % 27.0 (L)    --/--/O POS, O POS Performed at [facility], [address] [place], [place]  [address]  [phone](01/11 0751)  Imaging:  No results found.  MAU Course/MDM: I have ordered labs as follows: CBC and cmet, UA, Urine culture Imaging ordered: none Results reviewed. WBC is still elevated, though less so than prior to discharge,  Urine showed evidence of UTI with rare Squamous Epithel (good clean catch)  Will send to culture  Consult Dr Vergie LivingPickens.  He recommends treating presumptively for UTI and having close followup in office.  I told her to call today to be seen prior to weekend.   Treatments in MAU included IV hydration which improved tachycardia.  Ibuprofen given which totally resolved abd cramping and back pain. .   Pt stable at time of discharge.  Assessment: Postoperative Day #4 Uterine cramping  Back pain Presumed Urinary Tract Infection Tachycardia, likely due to poor water intake and UTI  Plan: Discharge home Recommend Add Ibuprofen to pain regimen.  Write down when she takes meds to keep track.   Rx sent for keflex for UTI Call office today to be seen today or tomorrow.  Rest and fluids today.  Limit outside trips and activities.  Breastfeed to empty breasts (milk came in)  Encouraged to return here or to other Urgent Care/ED if she develops worsening of symptoms, increase in pain, fever, or other concerning symptoms.   Wynelle BourgeoisMarie Erline Siddoway CNM, MSN Certified Nurse-Midwife 11/05/2018 2:23 AM

## 2018-11-05 NOTE — Addendum Note (Signed)
Addendum  created 11/05/18 0942 by Bethena Midget, MD   Intraprocedure Event deleted, Intraprocedure Event edited, Intraprocedure Staff edited

## 2018-11-06 LAB — URINE CULTURE

## 2020-10-31 DIAGNOSIS — Z0000 Encounter for general adult medical examination without abnormal findings: Secondary | ICD-10-CM | POA: Diagnosis not present

## 2020-11-15 DIAGNOSIS — H00024 Hordeolum internum left upper eyelid: Secondary | ICD-10-CM | POA: Diagnosis not present

## 2020-12-06 DIAGNOSIS — H52223 Regular astigmatism, bilateral: Secondary | ICD-10-CM | POA: Diagnosis not present

## 2020-12-26 ENCOUNTER — Encounter: Payer: Self-pay | Admitting: Family Medicine

## 2020-12-26 ENCOUNTER — Ambulatory Visit (INDEPENDENT_AMBULATORY_CARE_PROVIDER_SITE_OTHER): Payer: 59 | Admitting: Family Medicine

## 2020-12-26 ENCOUNTER — Other Ambulatory Visit: Payer: Self-pay

## 2020-12-26 ENCOUNTER — Ambulatory Visit: Payer: Self-pay

## 2020-12-26 VITALS — BP 100/66 | Ht 62.0 in | Wt 191.0 lb

## 2020-12-26 DIAGNOSIS — M65332 Trigger finger, left middle finger: Secondary | ICD-10-CM | POA: Diagnosis not present

## 2020-12-26 DIAGNOSIS — M79642 Pain in left hand: Secondary | ICD-10-CM

## 2020-12-26 MED ORDER — TRIAMCINOLONE ACETONIDE 40 MG/ML IJ SUSP
20.0000 mg | Freq: Once | INTRAMUSCULAR | Status: AC
  Administered 2020-12-26: 20 mg via INTRA_ARTICULAR

## 2020-12-26 NOTE — Patient Instructions (Signed)
Nice to meet you Please try the splint at night  Please send me a message in MyChart with any questions or updates.  Please see me back in 4 weeks.   --Dr. Jordan Likes

## 2020-12-26 NOTE — Assessment & Plan Note (Signed)
Symptoms seem most consistent with trigger finger.  No suggestion of synovitis on exam. -Counseled on home exercise therapy and supportive care. -Provided splint. -Injection. -Could repeat injection if needed.

## 2020-12-26 NOTE — Progress Notes (Signed)
Merryl Buckels - 35 y.o. female MRN [id number]  Date of birth: 05/30/1986  SUBJECTIVE:  Including CC & ROS.  No chief complaint on file.   Shauniece Debhora Titus is a 35 y.o. female that is presenting with left middle finger pain.  The pain is been ongoing for a few weeks.  She is trying Voltaren with limited improvement.  Denies any injury or inciting event.  Pain seems worse in the morning.   Review of Systems See HPI   HISTORY: Past Medical, Surgical, Social, and Family History Reviewed & Updated per EMR.   Pertinent Historical Findings include:  Past Medical History:  Diagnosis Date  . Chicken pox   . Medical history non-contributory   . Spinal dysraphism (HCC)    subtle, non-closure of lower vertabrae    Past Surgical History:  Procedure Laterality Date  . BREAST BIOPSY  2007   Right side   . BREAST SURGERY Right 2007   cyst removed   . CESAREAN SECTION N/A 11/01/2018   Procedure: CESAREAN SECTION;  Surgeon: Waynard Reeds, MD;  Location: [facility] BIRTHING SUITES;  Service: Obstetrics;  Laterality: N/A;    Family History  Problem Relation Age of Onset  . Other Mother     Social History   Socioeconomic History  . Marital status: Married    Spouse name: Not on file  . Number of children: Not on file  . Years of education: Not on file  . Highest education level: Some college, no degree  Occupational History    Comment: Unemployed   Tobacco Use  . Smoking status: Never Smoker  . Smokeless tobacco: Never Used  Vaping Use  . Vaping Use: Never used  Substance and Sexual Activity  . Alcohol use: No  . Drug use: No  . Sexual activity: Not on file  Other Topics Concern  . Not on file  Social History Narrative   Left handed, occasional caffeine lives with husband      Social Determinants of Health   Financial Resource Strain: Not on file  Food Insecurity: Not on file  Transportation Needs: Not on file  Physical Activity: Not on file  Stress: Not on file  Social  Connections: Not on file  Intimate Partner Violence: Not on file     PHYSICAL EXAM:  VS: BP 100/66 (BP Location: Left Arm, Patient Position: Sitting, Cuff Size: Normal)   Ht 5\' 2"  (1.575 m)   Wt 191 lb (86.6 kg)   BMI 34.93 kg/m  Physical Exam Gen: NAD, alert, cooperative with exam, well-appearing MSK:  Left hand: Normal range of motion. Tenderness over the flexor tendon of the third digit. No redness. Neurovascularly intact  Limited ultrasound: Left middle finger:  No synovitis or hyperemia appreciated over the third MCP joint on the dorsal aspect. There appears to be a nodule at the A1 pulley.  Mild triggering evident on dynamic exam.  Summary: Findings would suggest third digit trigger finger  Ultrasound and interpretation by , MD   Aspiration/Injection Procedure Note Marlen Koman [facility] 08/17/1986  Procedure: Injection Indications: Left middle finger trigger finger  Procedure Details Consent: Risks of procedure as well as the alternatives and risks of each were explained to the (patient/caregiver).  Consent for procedure obtained. Time Out: Verified patient identification, verified procedure, site/side was marked, verified correct patient position, special equipment/implants available, medications/allergies/relevent history reviewed, required imaging and test results available.  Performed.  The area was cleaned with iodine and alcohol swabs.    The  left middle finger trigger finger was injected using 0.5 cc's of Kenalog and 0.5 cc's of 0.25% bupivacaine with a 25 1 1/2" needle.  Ultrasound was used. Images were obtained in long views showing the injection.     A sterile dressing was applied.  Patient did tolerate procedure well.    ASSESSMENT & PLAN:   Trigger middle finger of left hand Symptoms seem most consistent with trigger finger.  No suggestion of synovitis on exam. -Counseled on home exercise therapy and supportive care. -Provided  splint. -Injection. -Could repeat injection if needed.

## 2021-01-10 DIAGNOSIS — Z01419 Encounter for gynecological examination (general) (routine) without abnormal findings: Secondary | ICD-10-CM | POA: Diagnosis not present

## 2021-01-10 DIAGNOSIS — Z124 Encounter for screening for malignant neoplasm of cervix: Secondary | ICD-10-CM | POA: Diagnosis not present

## 2021-01-22 ENCOUNTER — Other Ambulatory Visit: Payer: Self-pay

## 2021-01-22 ENCOUNTER — Ambulatory Visit (INDEPENDENT_AMBULATORY_CARE_PROVIDER_SITE_OTHER): Payer: 59 | Admitting: Family Medicine

## 2021-01-22 ENCOUNTER — Encounter: Payer: Self-pay | Admitting: Family Medicine

## 2021-01-22 DIAGNOSIS — M65332 Trigger finger, left middle finger: Secondary | ICD-10-CM | POA: Diagnosis not present

## 2021-01-22 NOTE — Patient Instructions (Signed)
Good to see you Please try the splint at night for another three weeks.   Please send me a message in MyChart with any questions or updates.  Please see [place] back as needed.   --Dr. Jordan Likes

## 2021-01-22 NOTE — Progress Notes (Signed)
  Kelly Roman - 35 y.o. female MRN [id number]  Date of birth: December 20, 1985  SUBJECTIVE:  Including CC & ROS.  No chief complaint on file.   Kelly Roman is a 34 y.o. female that is following up for her trigger finger.  She has been doing well with no pain since the injection.  She has continued to wear the splint.   Review of Systems See HPI   HISTORY: Past Medical, Surgical, Social, and Family History Reviewed & Updated per EMR.   Pertinent Historical Findings include:  Past Medical History:  Diagnosis Date  . Chicken pox   . Medical history non-contributory   . Spinal dysraphism (HCC)    subtle, non-closure of lower vertabrae    Past Surgical History:  Procedure Laterality Date  . BREAST BIOPSY  2007   Right side   . BREAST SURGERY Right 2007   cyst removed   . CESAREAN SECTION N/A 11/01/2018   Procedure: CESAREAN SECTION;  Surgeon: Waynard Reeds, MD;  Location: [facility] BIRTHING SUITES;  Service: Obstetrics;  Laterality: N/A;    Family History  Problem Relation Age of Onset  . Other Mother     Social History   Socioeconomic History  . Marital status: Married    Spouse name: Not on file  . Number of children: Not on file  . Years of education: Not on file  . Highest education level: Some college, no degree  Occupational History    Comment: Unemployed   Tobacco Use  . Smoking status: Never Smoker  . Smokeless tobacco: Never Used  Vaping Use  . Vaping Use: Never used  Substance and Sexual Activity  . Alcohol use: No  . Drug use: No  . Sexual activity: Not on file  Other Topics Concern  . Not on file  Social History Narrative   Left handed, occasional caffeine lives with husband      Social Determinants of Health   Financial Resource Strain: Not on file  Food Insecurity: Not on file  Transportation Needs: Not on file  Physical Activity: Not on file  Stress: Not on file  Social Connections: Not on file  Intimate Partner Violence: Not on file      PHYSICAL EXAM:  VS: BP 112/72 (BP Location: Left Arm, Patient Position: Sitting, Cuff Size: Large)   Ht 5\' 2"  (1.575 m)   Wt 191 lb (86.6 kg)   BMI 34.93 kg/m  Physical Exam Gen: NAD, alert, cooperative with exam, well-appearing    ASSESSMENT & PLAN:   Trigger middle finger of left hand Has had resolution since the injection. -Counseled on supportive care. -Counseled on splint use. -Follow-up as needed.

## 2021-01-22 NOTE — Assessment & Plan Note (Signed)
Has had resolution since the injection. -Counseled on supportive care. -Counseled on splint use. -Follow-up as needed.

## 2021-04-11 ENCOUNTER — Emergency Department (HOSPITAL_COMMUNITY): Payer: 59

## 2021-04-11 ENCOUNTER — Other Ambulatory Visit: Payer: Self-pay

## 2021-04-11 ENCOUNTER — Emergency Department (HOSPITAL_COMMUNITY)
Admission: EM | Admit: 2021-04-11 | Discharge: 2021-04-11 | Disposition: A | Discharge status: Home / Self Care | Payer: 59 | Attending: Emergency Medicine | Admitting: Emergency Medicine

## 2021-04-11 DIAGNOSIS — R002 Palpitations: Secondary | ICD-10-CM | POA: Insufficient documentation

## 2021-04-11 LAB — CBC WITH DIFFERENTIAL/PLATELET
Abs Immature Granulocytes: 0.03 10*3/uL (ref 0.00–0.07)
Basophils Absolute: 0 10*3/uL (ref 0.0–0.1)
Basophils Relative: 0 %
Eosinophils Absolute: 0 10*3/uL (ref 0.0–0.5)
Eosinophils Relative: 0 %
HCT: 39.1 % (ref 36.0–46.0)
Hemoglobin: 12.8 g/dL (ref 12.0–15.0)
Immature Granulocytes: 0 %
Lymphocytes Relative: 18 %
Lymphs Abs: 1.9 10*3/uL (ref 0.7–4.0)
MCH: 28.3 pg (ref 26.0–34.0)
MCHC: 32.7 g/dL (ref 30.0–36.0)
MCV: 86.3 fL (ref 80.0–100.0)
Monocytes Absolute: 0.9 10*3/uL (ref 0.1–1.0)
Monocytes Relative: 8 %
Neutro Abs: 7.4 10*3/uL (ref 1.7–7.7)
Neutrophils Relative %: 74 %
Platelets: 275 10*3/uL (ref 150–400)
RBC: 4.53 MIL/uL (ref 3.87–5.11)
RDW: 12.9 % (ref 11.5–15.5)
WBC: 10.2 10*3/uL (ref 4.0–10.5)
nRBC: 0 % (ref 0.0–0.2)

## 2021-04-11 LAB — COMPREHENSIVE METABOLIC PANEL
ALT: 27 U/L (ref 0–44)
AST: 25 U/L (ref 15–41)
Albumin: 4.1 g/dL (ref 3.5–5.0)
Alkaline Phosphatase: 64 U/L (ref 38–126)
Anion gap: 5 (ref 5–15)
BUN: 12 mg/dL (ref 6–20)
CO2: 26 mmol/L (ref 22–32)
Calcium: 9.2 mg/dL (ref 8.9–10.3)
Chloride: 104 mmol/L (ref 98–111)
Creatinine, Ser: 0.63 mg/dL (ref 0.44–1.00)
GFR, Estimated: >60 mL/min (ref >60)
Glucose, Bld: 152 mg/dL — ABNORMAL HIGH (ref 70–99)
Potassium: 3.1 mmol/L — ABNORMAL LOW (ref 3.5–5.1)
Sodium: 135 mmol/L (ref 135–145)
Total Bilirubin: 0.3 mg/dL (ref 0.3–1.2)
Total Protein: 7.9 g/dL (ref 6.5–8.1)

## 2021-04-11 LAB — I-STAT BETA HCG BLOOD, ED (MC, WL, AP ONLY): I-stat hCG, quantitative: <5 m[IU]/mL (ref <5)

## 2021-04-11 LAB — TSH: TSH: 1.261 u[IU]/mL (ref 0.350–4.500)

## 2021-04-11 MED ORDER — SODIUM CHLORIDE 0.9 % IV BOLUS
1000.0000 mL | Freq: Once | INTRAVENOUS | Status: AC
  Administered 2021-04-11: 1000 mL via INTRAVENOUS

## 2021-04-11 NOTE — Discharge Instructions (Addendum)
  All the results in the ER are normal, labs and imaging. Heart rate normalized by itself overtime. No signs of any arrhythmia.   The workup in the ER is not complete, and is limited to screening for life threatening and emergent conditions only, so please see a primary care doctor for further evaluation.

## 2021-04-11 NOTE — ED Provider Notes (Signed)
Emergency Medicine Provider Triage Evaluation Note  Kelly Roman 35 y.o. female was evaluated in triage.  Pt complains of tachycardia, palpitations.  Patient reports she was at work earlier and states that she felt like her heart started beating really fast.  She states that she did not have any chest pain.  She states that this happened 1 time previously after she had her baby.  She reports that they do not know what caused it.  She never follow-up with a cardiologist.  She reports drinking coffee this morning but states she drink her normal amount.  No increased caffeine intake.  No new medications, medication changes.  She denies any recent fevers, cough, chills.  She does not smoke.  No marijuana, cocaine, heroin use.She denies any OCP use, recent immobilization, prior history of DVT/PE, recent surgery, leg swelling, or long travel.  Review of Systems  Positive: Palpitations  Negative: CP, fevers, cough   Physical Exam  BP 134/82   Pulse 70   Temp 98.2 F (36.8 C) (Oral)   Resp 18   Ht 5\' 4"  (1.626 m)   Wt 65.8 kg   SpO2 100%   BMI 24.89 kg/m  Gen:   Awake, no distress   HEENT:  Atraumatic  Resp:  Normal effort  Cardiac:  Tachycardia  Abd:   Nondistended, nontender  MSK:   Moves extremities without difficulty  Neuro:  Speech clear   Other:      Medical Decision Making  Medically screening exam initiated at 3:25 PM  Appropriate orders placed.  Kelly Roman was informed that the remainder of the evaluation will be completed by another provider, this initial triage assessment does not replace that evaluation. They are counseled that they will need to remain in the ED until the completion of their workup, including full H&P and results of any tests.  Risks of leaving the emergency department prior to completion of treatment were discussed. Patient was advised to inform ED staff if they are leaving before their treatment is complete. The patient acknowledged these risks and time  was allowed for questions.     The patient appears stable so that the remainder of the MSE may be completed by another provider.    Clinical Impression  Palpitations   Portions of this note were generated with Dragon dictation software. Dictation errors may occur despite best attempts at proofreading.     Roanna Raider, PA-C 04/11/21 1527    04/13/21, MD 04/11/21 [phone]

## 2021-04-11 NOTE — ED Provider Notes (Signed)
[facility]-EMERGENCY DEPT Provider Note   CSN: [id number] Arrival date & time: 04/11/21  1503     History Chief Complaint  Patient presents with   Palpitations    Kelly Roman is a 35 y.o. female that significant past medical history, presenting to the emergency department with complaint of palpitations that began around 2:00 this afternoon.  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  She states she was rushing and thought that that combined with being in the heat caused her heart rate to elevate.  When she sat down her apple watch notified her of a fast heart rate.  However rest did not improve her heart racing and she began to worry about it.  She is not having any other associated symptoms with the palpitations.  Denies chest pain, shortness of breath, weakness, headache.  She has her normal cup of coffee in the morning and had that today.  No increased caffeine intake, no drug or alcohol use.  No history of blood clot or OCP use.  No recent immobilization, surgery, trauma, or personal history of cancer.  This is happened once in the past she states the few days after she delivered her child a few years ago.  She states they did a big work-up and found no source.  No known thyroid disorder.  Normal p.o. intake.  The history is provided by the patient.      Past Medical History:  Diagnosis Date   Chicken pox    Medical history non-contributory    Spinal dysraphism (HCC)    subtle, non-closure of lower vertabrae    Patient Active Problem List   Diagnosis Date Noted   Trigger middle finger of left hand 12/26/2020   Cesarean delivery, delivered, current hospitalization 11/01/2018   Indication for care in labor and delivery, antepartum 10/31/2018    Past Surgical History:  Procedure Laterality Date   BREAST BIOPSY  2007   Right side    BREAST SURGERY Right 2007   cyst removed    CESAREAN SECTION N/A 11/01/2018    Procedure: CESAREAN SECTION;  Surgeon: Waynard Reeds, MD;  Location: [facility] BIRTHING SUITES;  Service: Obstetrics;  Laterality: N/A;     OB History     Gravida  1   Para  1   Term  1   Preterm      AB      Living  1      SAB      IAB      Ectopic      Multiple  0   Live Births  1           Family History  Problem Relation Age of Onset   Other Mother     Social History   Tobacco Use   Smoking status: Never   Smokeless tobacco: Never  Vaping Use   Vaping Use: Never used  Substance Use Topics   Alcohol use: No   Drug use: No    Home Medications Prior to Admission medications   Medication Sig Start Date End Date Taking? Authorizing Provider  cephALEXin (KEFLEX) 500 MG capsule Take 1 capsule (500 mg total) by mouth 4 (four) times daily. 11/05/18   Aviva Signs, CNM  ibuprofen (ADVIL,MOTRIN) 600 MG tablet Take 1 tablet (600 mg total) by mouth every 6 (six) hours as needed. 11/05/18   Aviva Signs, CNM  oxyCODONE-acetaminophen (PERCOCET/ROXICET) [phone]  MG tablet Take 1-2 tablets by mouth every 4 (four) hours as needed for moderate pain. 11/03/18   Carrington Clamp, MD  Prenatal Vit-Fe Fumarate-FA (PRENATAL MULTIVITAMIN) TABS tablet Take 1 tablet by mouth daily at 12 noon.    [provider]    Allergies    Patient has no known allergies.  Review of Systems   Review of Systems  All other systems reviewed and are negative.  Physical Exam Updated Vital Signs BP 113/82   Pulse 80   Temp 97.9 F (36.6 C) (Oral)   Resp 15   SpO2 98%   Physical Exam Vitals and nursing note reviewed.  Constitutional:      Appearance: She is well-developed.  HENT:     Head: Normocephalic and atraumatic.  Eyes:     Conjunctiva/sclera: Conjunctivae normal.  Cardiovascular:     Rate and Rhythm: Regular rhythm. Tachycardia present.     Pulses: Normal pulses.  Pulmonary:     Effort: Pulmonary effort is normal. No respiratory distress.     Breath  sounds: Normal breath sounds.  Abdominal:     Palpations: Abdomen is soft.  Musculoskeletal:     Right lower leg: No edema.     Left lower leg: No edema.  Skin:    General: Skin is warm.  Neurological:     Mental Status: She is alert.  Psychiatric:        Mood and Affect: Mood is anxious.        Behavior: Behavior normal.    ED Results / Procedures / Treatments   Labs (all labs ordered are listed, but only abnormal results are displayed) Labs Reviewed  COMPREHENSIVE METABOLIC PANEL - Abnormal; Notable for the following components:      Result Value   Potassium 3.1 (*)    Glucose, Bld 152 (*)    All other components within normal limits  CBC WITH DIFFERENTIAL/PLATELET  TSH  I-STAT BETA HCG BLOOD, ED ([facility], [facility], [facility] ONLY)    EKG  EKG Interpretation  Date/Time:  Wednesday April 11 2021 15:45:17 EDT Ventricular Rate:  124 PR Interval:  153 QRS Duration: 77 QT Interval:  307 QTC Calculation: 441 R Axis:   67 Text Interpretation: Sinus tachycardia Multiple ventricular premature complexes Aberrant complex Probable left atrial enlargement unchanged from previous ekg Confirmed by Derwood Kaplan [phone]) on 04/11/2021 6:26:06 PM  Repeat EKG 11-Apr-2021 18:31:27 Vent. rate 89 BPM PR interval 148 ms QRS duration 76 ms QT/QTcB 339/413 ms P-R-T axes 58 63 57 Sinus Rhythm  Radiology DG Chest 2 View  Result Date: 04/11/2021 CLINICAL DATA:  Palpitation EXAM: CHEST - 2 VIEW COMPARISON:  None. FINDINGS: The heart size and mediastinal contours are within normal limits. Both lungs are clear. The visualized skeletal structures are unremarkable. IMPRESSION: No active cardiopulmonary disease. Electronically Signed   By: Jasmine Pang M.D.   On: 04/11/2021 16:22    Procedures Procedures   Medications Ordered in ED Medications  sodium chloride 0.9 % bolus 1,000 mL (0 mLs Intravenous Stopped 04/11/21 1823)    ED Course  I have reviewed the triage vital signs and the nursing  notes.  Pertinent labs & imaging results that were available during my care of the patient were reviewed by me and considered in my medical decision making (see chart for details).    MDM Rules/Calculators/A&P                          Patient  is a healthy 35 year old female presenting for evaluation of palpitations that began this afternoon around 2 PM.  She states initially she was rushing to get to work on time and was also pink with heat which she initially attributed her symptoms to.  However her tachycardia persisted and she began to feel anxious.  Normal p.o. intake.  No changes in caffeine intake.  No risk factors for PE.  On arrival she is in sinus tachycardia 130s to 140s.  She does appear anxious and is constantly checking the cardiac monitor.  She is not having any chest pain or shortness of breath, otherwise she is asymptomatic.  Blood work obtained in triage is overall reassuring, no anemia or significant electrolyte derangement.  She does have mildly low potassium at 3.1, discussed dietary changes to increase potassium rich foods.  Pregnancy test is negative.  TSH is within normal limits.  She is given IV fluids and reassurance.  On reevaluation her heart rate normalized, she is much calmer on evaluation.  Believe there was some large anxiety component, patient is agreeing with this.  I recommend she follow closely with her primary care, may benefit from Holter monitor.  Patient is discharged by attending physician.   Final Clinical Impression(s) / ED Diagnoses Final diagnoses:  Palpitations    Rx / DC Orders ED Discharge Orders     None        Mikhael Hendriks, [place] N, PA-C 04/12/21 0254    Derwood Kaplan, MD 04/13/21 1505

## 2021-04-11 NOTE — ED Triage Notes (Signed)
Pt reports her heart began racing when she was clocking into work today at 1430. No SOB or dizziness.

## 2021-05-17 ENCOUNTER — Ambulatory Visit: Payer: Self-pay

## 2021-05-17 ENCOUNTER — Other Ambulatory Visit: Payer: Self-pay

## 2021-05-17 ENCOUNTER — Ambulatory Visit (INDEPENDENT_AMBULATORY_CARE_PROVIDER_SITE_OTHER): Payer: 59 | Admitting: Family Medicine

## 2021-05-17 ENCOUNTER — Encounter: Payer: Self-pay | Admitting: Family Medicine

## 2021-05-17 VITALS — BP 120/78 | Ht 62.0 in | Wt 195.0 lb

## 2021-05-17 DIAGNOSIS — M79674 Pain in right toe(s): Secondary | ICD-10-CM

## 2021-05-17 DIAGNOSIS — S92514A Nondisplaced fracture of proximal phalanx of right lesser toe(s), initial encounter for closed fracture: Secondary | ICD-10-CM | POA: Diagnosis not present

## 2021-05-17 NOTE — Patient Instructions (Signed)
Good to see you Please try buddy taping for 3-4 weeks  Please use ice   Please send me a message in MyChart with any questions or updates.  Please see [place] back as needed.   --Dr. Jordan Likes

## 2021-05-17 NOTE — Assessment & Plan Note (Signed)
Nondisplaced fracture of the little toe.  Pain is controlled. -Counseled on home exercise therapy and supportive care. -Counseled on buddy taping. -Could consider postop shoe.

## 2021-05-17 NOTE — Progress Notes (Signed)
  Kelly Roman - 35 y.o. female MRN [id number]  Date of birth: 01/12/1986  SUBJECTIVE:  Including CC & ROS.  No chief complaint on file.   Kelly Roman is a 35 y.o. female that is presenting with acute fifth digit toe pain.  She stubbed her toe on the stairs a few days ago.  Has been swelling and bruising.  Having moderate pain.  Has taken some medication today.  Is able to walk.    Review of Systems See HPI   HISTORY: Past Medical, Surgical, Social, and Family History Reviewed & Updated per EMR.   Pertinent Historical Findings include:  Past Medical History:  Diagnosis Date   Chicken pox    Medical history non-contributory    Spinal dysraphism (HCC)    subtle, non-closure of lower vertabrae    Past Surgical History:  Procedure Laterality Date   BREAST BIOPSY  2007   Right side    BREAST SURGERY Right 2007   cyst removed    CESAREAN SECTION N/A 11/01/2018   Procedure: CESAREAN SECTION;  Surgeon: Waynard Reeds, MD;  Location: [facility] BIRTHING SUITES;  Service: Obstetrics;  Laterality: N/A;    Family History  Problem Relation Age of Onset   Other Mother     Social History   Socioeconomic History   Marital status: Married    Spouse name: Not on file   Number of children: Not on file   Years of education: Not on file   Highest education level: Some college, no degree  Occupational History    Comment: Unemployed   Tobacco Use   Smoking status: Never   Smokeless tobacco: Never  Vaping Use   Vaping Use: Never used  Substance and Sexual Activity   Alcohol use: No   Drug use: No   Sexual activity: Not on file  Other Topics Concern   Not on file  Social History Narrative   Left handed, occasional caffeine lives with husband      Social Determinants of Health   Financial Resource Strain: Not on file  Food Insecurity: Not on file  Transportation Needs: Not on file  Physical Activity: Not on file  Stress: Not on file  Social Connections: Not on file  Intimate  Partner Violence: Not on file     PHYSICAL EXAM:  VS: BP 120/78 (BP Location: Left Arm, Patient Position: Sitting, Cuff Size: Large)   Ht 5\' 2"  (1.575 m)   Wt 195 lb (88.5 kg)   BMI 35.67 kg/m  Physical Exam Gen: NAD, alert, cooperative with exam, well-appearing MSK:  Right foot: Ecchymosis and swelling of the proximal phalanx of the fifth digit. Tenderness to palpation. Neurovascular intact  Limited ultrasound: Right fifth digit:  Normal-appearing fifth MTP joint. There appears to be a oblique nondisplaced fracture of the proximal phalanx. No changes of the interphalangeal joint. Normal-appearing distal phalanx.  Summary: Proximal phalanx fracture  Ultrasound and interpretation by , MD    ASSESSMENT & PLAN:   Closed nondisplaced fracture of proximal phalanx of lesser toe of right foot Nondisplaced fracture of the little toe.  Pain is controlled. -Counseled on home exercise therapy and supportive care. -Counseled on buddy taping. -Could consider postop shoe.

## 2021-06-05 ENCOUNTER — Encounter: Payer: Self-pay | Admitting: Family Medicine

## 2021-06-20 ENCOUNTER — Encounter: Payer: Self-pay | Admitting: Family Medicine

## 2021-06-20 ENCOUNTER — Ambulatory Visit (INDEPENDENT_AMBULATORY_CARE_PROVIDER_SITE_OTHER): Payer: 59 | Admitting: Family Medicine

## 2021-06-20 ENCOUNTER — Ambulatory Visit (HOSPITAL_BASED_OUTPATIENT_CLINIC_OR_DEPARTMENT_OTHER)
Admission: RE | Admit: 2021-06-20 | Discharge: 2021-06-20 | Disposition: A | Discharge status: Home / Self Care | Payer: 59 | Source: Ambulatory Visit | Attending: Family Medicine | Admitting: Family Medicine

## 2021-06-20 ENCOUNTER — Other Ambulatory Visit: Payer: Self-pay

## 2021-06-20 DIAGNOSIS — S92514D Nondisplaced fracture of proximal phalanx of right lesser toe(s), subsequent encounter for fracture with routine healing: Secondary | ICD-10-CM | POA: Diagnosis not present

## 2021-06-20 DIAGNOSIS — M7989 Other specified soft tissue disorders: Secondary | ICD-10-CM | POA: Diagnosis not present

## 2021-06-20 NOTE — Progress Notes (Signed)
  Kelly Roman - 35 y.o. female MRN [id number]  Date of birth: [birth year]  SUBJECTIVE:  Including CC & ROS.  No chief complaint on file.   Kelly Roman is a 35 y.o. female that is presenting with acute worsening of her right little toe pain.  Has been observed to have a fracture.  Pain seems to be somewhat improving but swelling is significantly worse.  Feels the pain when she is walking.    Review of Systems See HPI   HISTORY: Past Medical, Surgical, Social, and Family History Reviewed & Updated per EMR.   Pertinent Historical Findings include:  Past Medical History:  Diagnosis Date   Chicken pox    Medical history non-contributory    Spinal dysraphism (HCC)    subtle, non-closure of lower vertabrae    Past Surgical History:  Procedure Laterality Date   BREAST BIOPSY  2007   Right side    BREAST SURGERY Right 2007   cyst removed    CESAREAN SECTION N/A 11/01/2018   Procedure: CESAREAN SECTION;  Surgeon: Waynard Reeds, MD;  Location: [facility] BIRTHING SUITES;  Service: Obstetrics;  Laterality: N/A;    Family History  Problem Relation Age of Onset   Other Mother     Social History   Socioeconomic History   Marital status: Married    Spouse name: Not on file   Number of children: Not on file   Years of education: Not on file   Highest education level: Some college, no degree  Occupational History    Comment: Unemployed   Tobacco Use   Smoking status: Never   Smokeless tobacco: Never  Vaping Use   Vaping Use: Never used  Substance and Sexual Activity   Alcohol use: No   Drug use: No   Sexual activity: Not on file  Other Topics Concern   Not on file  Social History Narrative   Left handed, occasional caffeine lives with husband      Social Determinants of Health   Financial Resource Strain: Not on file  Food Insecurity: Not on file  Transportation Needs: Not on file  Physical Activity: Not on file  Stress: Not on file  Social Connections: Not on file   Intimate Partner Violence: Not on file     PHYSICAL EXAM:  VS: Ht 5\' 2"  (1.575 m)   Wt 195 lb (88.5 kg)   BMI 35.67 kg/m  Physical Exam Gen: NAD, alert, cooperative with exam, well-appearing       ASSESSMENT & PLAN:   Closed nondisplaced fracture of proximal phalanx of lesser toe of right foot Initial injury around 7/26.  Was observed to have a fracture on ultrasound.  Having ongoing swelling and pain today. -Postop shoe. -Hammertoe padding applied. -X-ray.

## 2021-06-20 NOTE — Patient Instructions (Signed)
Good to see you Please use ice  Please try the shoe and padding  I will call with the xray results.   Please send me a message in MyChart with any questions or updates.  Please see me back in 2 weeks.   --Dr. Jordan Likes

## 2021-06-20 NOTE — Assessment & Plan Note (Addendum)
Initial injury around 7/26.  Was observed to have a fracture on ultrasound.  Having ongoing swelling and pain today. -Postop shoe. -Hammertoe padding applied. -X-ray.

## 2021-06-26 ENCOUNTER — Telehealth: Payer: Self-pay | Admitting: Family Medicine

## 2021-06-26 NOTE — Telephone Encounter (Signed)
Pt informed of below.  

## 2021-06-26 NOTE — Telephone Encounter (Signed)
Left VM for patient. If she calls back please have her speak with a nurse/CMA and inform that her xray is showing healing of the fracture.   If any questions then please take the best time and phone number to call and I will try to call her back.   Myra Rude, MD [facility] 06/26/2021, 8:07 AM

## 2021-07-03 ENCOUNTER — Ambulatory Visit (INDEPENDENT_AMBULATORY_CARE_PROVIDER_SITE_OTHER): Payer: 59 | Admitting: Family Medicine

## 2021-07-03 ENCOUNTER — Other Ambulatory Visit: Payer: Self-pay

## 2021-07-03 ENCOUNTER — Encounter: Payer: Self-pay | Admitting: Family Medicine

## 2021-07-03 DIAGNOSIS — S92514D Nondisplaced fracture of proximal phalanx of right lesser toe(s), subsequent encounter for fracture with routine healing: Secondary | ICD-10-CM | POA: Diagnosis not present

## 2021-07-03 NOTE — Patient Instructions (Signed)
Good to see you You can stop the shoe  Please either buddy tape or use the cushion pad   Please send me a message in MyChart with any questions or updates.  Please see me back as needed.   --Dr. Jordan Likes

## 2021-07-03 NOTE — Progress Notes (Signed)
  Kelly Roman - 35 y.o. female MRN [id number]  Date of birth: Apr 15, 1986  SUBJECTIVE:  Including CC & ROS.  No chief complaint on file.   Kelly Roman is a 35 y.o. female that is following up for her right toe fracture.  Denies any pain today but does get swelling.  Postop shoe has helped.   Review of Systems See HPI   HISTORY: Past Medical, Surgical, Social, and Family History Reviewed & Updated per EMR.   Pertinent Historical Findings include:  Past Medical History:  Diagnosis Date   Chicken pox    Medical history non-contributory    Spinal dysraphism (HCC)    subtle, non-closure of lower vertabrae    Past Surgical History:  Procedure Laterality Date   BREAST BIOPSY  2007   Right side    BREAST SURGERY Right 2007   cyst removed    CESAREAN SECTION N/A 11/01/2018   Procedure: CESAREAN SECTION;  Surgeon: Waynard Reeds, MD;  Location: [facility] BIRTHING SUITES;  Service: Obstetrics;  Laterality: N/A;    Family History  Problem Relation Age of Onset   Other Mother     Social History   Socioeconomic History   Marital status: Married    Spouse name: Not on file   Number of children: Not on file   Years of education: Not on file   Highest education level: Some college, no degree  Occupational History    Comment: Unemployed   Tobacco Use   Smoking status: Never   Smokeless tobacco: Never  Vaping Use   Vaping Use: Never used  Substance and Sexual Activity   Alcohol use: No   Drug use: No   Sexual activity: Not on file  Other Topics Concern   Not on file  Social History Narrative   Left handed, occasional caffeine lives with husband      Social Determinants of Health   Financial Resource Strain: Not on file  Food Insecurity: Not on file  Transportation Needs: Not on file  Physical Activity: Not on file  Stress: Not on file  Social Connections: Not on file  Intimate Partner Violence: Not on file     PHYSICAL EXAM:  VS: Ht 5\' 2"  (1.575 m)   Wt 195 lb  (88.5 kg)   BMI 35.67 kg/m  Physical Exam Gen: NAD, alert, cooperative with exam, well-appearing      ASSESSMENT & PLAN:   Closed nondisplaced fracture of proximal phalanx of lesser toe of right foot Initial injury around 7/26.  No pain today but does get swelling. -Counseled on home exercise therapy and supportive care. -Counseled on cushioning and buddy taping. -Can follow-up as needed.

## 2021-07-03 NOTE — Assessment & Plan Note (Signed)
Initial injury around 7/26.  No pain today but does get swelling. -Counseled on home exercise therapy and supportive care. -Counseled on cushioning and buddy taping. -Can follow-up as needed.

## 2021-07-24 DIAGNOSIS — Z733 Stress, not elsewhere classified: Secondary | ICD-10-CM | POA: Diagnosis not present

## 2021-07-24 DIAGNOSIS — R519 Headache, unspecified: Secondary | ICD-10-CM | POA: Diagnosis not present

## 2021-07-24 DIAGNOSIS — R5383 Other fatigue: Secondary | ICD-10-CM | POA: Diagnosis not present

## 2021-07-24 DIAGNOSIS — R000 Tachycardia, unspecified: Secondary | ICD-10-CM | POA: Diagnosis not present

## 2021-07-24 DIAGNOSIS — R002 Palpitations: Secondary | ICD-10-CM | POA: Diagnosis not present

## 2021-08-02 DIAGNOSIS — R002 Palpitations: Secondary | ICD-10-CM | POA: Diagnosis not present

## 2021-08-02 DIAGNOSIS — F419 Anxiety disorder, unspecified: Secondary | ICD-10-CM | POA: Diagnosis not present

## 2021-08-06 ENCOUNTER — Emergency Department (HOSPITAL_COMMUNITY): Payer: 59

## 2021-08-06 ENCOUNTER — Encounter (HOSPITAL_COMMUNITY): Payer: Self-pay | Admitting: *Deleted

## 2021-08-06 ENCOUNTER — Emergency Department (HOSPITAL_COMMUNITY)
Admission: EM | Admit: 2021-08-06 | Discharge: 2021-08-06 | Disposition: A | Discharge status: Home / Self Care | Payer: 59 | Attending: Emergency Medicine | Admitting: Emergency Medicine

## 2021-08-06 DIAGNOSIS — E876 Hypokalemia: Secondary | ICD-10-CM | POA: Insufficient documentation

## 2021-08-06 DIAGNOSIS — R002 Palpitations: Secondary | ICD-10-CM | POA: Diagnosis not present

## 2021-08-06 DIAGNOSIS — R000 Tachycardia, unspecified: Secondary | ICD-10-CM | POA: Insufficient documentation

## 2021-08-06 LAB — TSH: TSH: 0.94 u[IU]/mL (ref 0.350–4.500)

## 2021-08-06 LAB — URINALYSIS, ROUTINE W REFLEX MICROSCOPIC
Bilirubin Urine: NEGATIVE
Glucose, UA: NEGATIVE mg/dL
Ketones, ur: 5 mg/dL — AB
Leukocytes,Ua: NEGATIVE
Nitrite: NEGATIVE
Protein, ur: NEGATIVE mg/dL
Specific Gravity, Urine: 1.004 — ABNORMAL LOW (ref 1.005–1.030)
pH: 6 (ref 5.0–8.0)

## 2021-08-06 LAB — COMPREHENSIVE METABOLIC PANEL
ALT: 32 U/L (ref 0–44)
AST: 34 U/L (ref 15–41)
Albumin: 4.3 g/dL (ref 3.5–5.0)
Alkaline Phosphatase: 65 U/L (ref 38–126)
Anion gap: 8 (ref 5–15)
BUN: 12 mg/dL (ref 6–20)
CO2: 24 mmol/L (ref 22–32)
Calcium: 9.1 mg/dL (ref 8.9–10.3)
Chloride: 102 mmol/L (ref 98–111)
Creatinine, Ser: 0.66 mg/dL (ref 0.44–1.00)
GFR, Estimated: >60 mL/min (ref >60)
Glucose, Bld: 125 mg/dL — ABNORMAL HIGH (ref 70–99)
Potassium: 3.3 mmol/L — ABNORMAL LOW (ref 3.5–5.1)
Sodium: 134 mmol/L — ABNORMAL LOW (ref 135–145)
Total Bilirubin: 0.6 mg/dL (ref 0.3–1.2)
Total Protein: 8.1 g/dL (ref 6.5–8.1)

## 2021-08-06 LAB — CBC WITH DIFFERENTIAL/PLATELET
Abs Immature Granulocytes: 0.04 10*3/uL (ref 0.00–0.07)
Basophils Absolute: 0 10*3/uL (ref 0.0–0.1)
Basophils Relative: 0 %
Eosinophils Absolute: 0 10*3/uL (ref 0.0–0.5)
Eosinophils Relative: 0 %
HCT: 40.6 % (ref 36.0–46.0)
Hemoglobin: 13.4 g/dL (ref 12.0–15.0)
Immature Granulocytes: 0 %
Lymphocytes Relative: 17 %
Lymphs Abs: 1.7 10*3/uL (ref 0.7–4.0)
MCH: 28.2 pg (ref 26.0–34.0)
MCHC: 33 g/dL (ref 30.0–36.0)
MCV: 85.3 fL (ref 80.0–100.0)
Monocytes Absolute: 0.7 10*3/uL (ref 0.1–1.0)
Monocytes Relative: 7 %
Neutro Abs: 7.7 10*3/uL (ref 1.7–7.7)
Neutrophils Relative %: 76 %
Platelets: 288 10*3/uL (ref 150–400)
RBC: 4.76 MIL/uL (ref 3.87–5.11)
RDW: 12.6 % (ref 11.5–15.5)
WBC: 10.2 10*3/uL (ref 4.0–10.5)
nRBC: 0 % (ref 0.0–0.2)

## 2021-08-06 LAB — T4, FREE: Free T4: 1.06 ng/dL (ref 0.61–1.12)

## 2021-08-06 LAB — MAGNESIUM: Magnesium: 1.9 mg/dL (ref 1.7–2.4)

## 2021-08-06 LAB — PREGNANCY, URINE: Preg Test, Ur: NEGATIVE

## 2021-08-06 MED ORDER — POTASSIUM CHLORIDE CRYS ER 20 MEQ PO TBCR: 20.0000 meq | EXTENDED_RELEASE_TABLET | Freq: Every day | ORAL | 0 refills | Status: AC

## 2021-08-06 MED ORDER — METOPROLOL TARTRATE 5 MG/5ML IV SOLN
5.0000 mg | Freq: Once | INTRAVENOUS | Status: AC
  Administered 2021-08-06: 5 mg via INTRAVENOUS
  Filled 2021-08-06: qty 5

## 2021-08-06 MED ORDER — SODIUM CHLORIDE 0.9 % IV BOLUS
2000.0000 mL | Freq: Once | INTRAVENOUS | Status: AC
  Administered 2021-08-06: 2000 mL via INTRAVENOUS

## 2021-08-06 MED ORDER — POTASSIUM CHLORIDE CRYS ER 20 MEQ PO TBCR
40.0000 meq | EXTENDED_RELEASE_TABLET | Freq: Once | ORAL | Status: AC
  Administered 2021-08-06: 40 meq via ORAL
  Filled 2021-08-06: qty 2

## 2021-08-06 MED ORDER — IOHEXOL 350 MG/ML SOLN
80.0000 mL | Freq: Once | INTRAVENOUS | Status: AC | PRN
  Administered 2021-08-06: 80 mL via INTRAVENOUS

## 2021-08-06 NOTE — ED Provider Notes (Signed)
  Physical Exam  BP 111/72   Pulse 98   Temp 98.2 F (36.8 C) (Oral)   Resp 17   Ht 5\' 2"  (1.575 m)   Wt 88.5 kg   SpO2 100%   BMI 35.67 kg/m   Physical Exam  ED Course/Procedures     Procedures  MDM  Care assumed at 3 PM.  Patient is here with tachycardia.  Signout pending labs including TSH and reassessment after 2 L bolus.  6:37 PM Patient was still borderline tachycardic after 2 L.  I ordered a CTA which showed no PE.  Potassium is slightly low and was supplemented.  Patient's heart rate came down to 90s after IV Lopressor.  Patient is prescribed metoprolol already by cardiology.  I told her to continue to take it and they may need to increase the dose.  Patient also has outpatient Holter monitor and echo ordered.       , MD 08/06/21 08/08/21

## 2021-08-06 NOTE — ED Notes (Signed)
Patient transported to CT 

## 2021-08-06 NOTE — ED Provider Notes (Signed)
[facility]-EMERGENCY DEPT Provider Note   CSN: [id number] Arrival date & time: 08/06/21  1333     History Chief Complaint  Patient presents with   Palpitations    Kelly Roman is a 35 y.o. female.  35 yo F with a cc of palpitations.  This been a problem off and on for her.  She was seen in the ED last June and had an unremarkable work-up.  Since then she seen her family doctor started on metoprolol and then was told that she needs referral to cardiology.  She ran out of her metoprolol about 4 days ago.  Was told that they were can represcribe it and simply try to discover what the underlying cause was.  She feels fatigued especially when she is up and ambulating.  She denies cough congestion or fever.  Denies nausea vomiting or diarrhea denies decreased oral intake denies stimulant use denies over-the-counter supplements.  Denies weight loss drugs.  The history is provided by the patient.  Palpitations Palpitations quality:  Fast Onset quality:  Sudden Duration:  1 day Timing:  Constant Progression:  Worsening Chronicity:  New Relieved by:  Nothing Worsened by:  Nothing Ineffective treatments:  None tried Associated symptoms: no chest pain, no cough, no dizziness, no nausea, no shortness of breath and no vomiting       Past Medical History:  Diagnosis Date   Chicken pox    Medical history non-contributory    Spinal dysraphism (HCC)    subtle, non-closure of lower vertabrae    Patient Active Problem List   Diagnosis Date Noted   Closed nondisplaced fracture of proximal phalanx of lesser toe of right foot 05/17/2021   Trigger middle finger of left hand 12/26/2020   Cesarean delivery, delivered, current hospitalization 11/01/2018   Indication for care in labor and delivery, antepartum 10/31/2018    Past Surgical History:  Procedure Laterality Date   BREAST BIOPSY  2007   Right side    BREAST SURGERY Right 2007   cyst removed    CESAREAN  SECTION N/A 11/01/2018   Procedure: CESAREAN SECTION;  Surgeon: Waynard Reeds, MD;  Location: [facility] BIRTHING SUIT[facility];  Service: Obstetrics;  Laterality: N/A;     OB History     Gravida  1   Para  1   Term  1   Preterm      AB      Living  1      SAB      IAB      Ectopic      Multiple  0   Live Births  1           Family History  Problem Relation Age of Onset   Other Mother     Social History   Tobacco Use   Smoking status: Never   Smokeless tobacco: Never  Vaping Use   Vaping Use: Never used  Substance Use Topics   Alcohol use: No   Drug use: No    Home Medications Prior to Admission medications   Medication Sig Start Date End Date Taking? Authorizing Provider  metoprolol succinate (TOPROL-XL) 25 MG 24 hr tablet Take 25 mg by mouth at bedtime. 08/02/21  Yes [provider]  cephALEXin (KEFLEX) 500 MG capsule Take 1 capsule (500 mg total) by mouth 4 (four) times daily. Patient not taking: No sig reported 11/05/18   Aviva Signs, CNM  ibuprofen (ADVIL,MOTRIN) 600 MG tablet Take 1 tablet (600 mg  total) by mouth every 6 (six) hours as needed. Patient not taking: No sig reported 11/05/18   Aviva Signs, CNM  oxyCODONE-acetaminophen (PERCOCET/ROXICET) 5-325 MG tablet Take 1-2 tablets by mouth every 4 (four) hours as needed for moderate pain. Patient not taking: No sig reported 11/03/18   Carrington Clamp, MD    Allergies    Patient has no known allergies.  Review of Systems   Review of Systems  Constitutional:  Negative for chills and fever.  HENT:  Negative for congestion and rhinorrhea.   Eyes:  Negative for redness and visual disturbance.  Respiratory:  Negative for cough, shortness of breath and wheezing.   Cardiovascular:  Positive for palpitations. Negative for chest pain.  Gastrointestinal:  Negative for nausea and vomiting.  Genitourinary:  Negative for dysuria and urgency.  Musculoskeletal:  Negative for arthralgias and  myalgias.  Skin:  Negative for pallor and wound.  Neurological:  Negative for dizziness and headaches.   Physical Exam Updated Vital Signs BP 125/81   Pulse (!) 120   Temp 98.2 F (36.8 C) (Oral)   Resp 19   SpO2 99%   Physical Exam Vitals and nursing note reviewed.  Constitutional:      General: She is not in acute distress.    Appearance: She is well-developed. She is not diaphoretic.  HENT:     Head: Normocephalic and atraumatic.  Eyes:     Pupils: Pupils are equal, round, and reactive to light.  Cardiovascular:     Rate and Rhythm: Regular rhythm. Tachycardia present.     Heart sounds: No murmur heard.   No friction rub. No gallop.  Pulmonary:     Effort: Pulmonary effort is normal.     Breath sounds: No wheezing or rales.  Abdominal:     General: There is no distension.     Palpations: Abdomen is soft.     Tenderness: There is no abdominal tenderness.  Musculoskeletal:        General: No tenderness.     Cervical back: Normal range of motion and neck supple.  Skin:    General: Skin is warm and dry.  Neurological:     Mental Status: She is alert and oriented to person, place, and time.  Psychiatric:        Behavior: Behavior normal.    ED Results / Procedures / Treatments   Labs (all labs ordered are listed, but only abnormal results are displayed) Labs Reviewed  CBC WITH DIFFERENTIAL/PLATELET  COMPREHENSIVE METABOLIC PANEL  MAGNESIUM  TSH  T4, FREE  URINALYSIS, ROUTINE W REFLEX MICROSCOPIC  I-STAT BETA HCG BLOOD, ED ([facility], [facility], [facility] ONLY)    EKG None  Radiology No results found.  Procedures Procedures   Medications Ordered in ED Medications  sodium chloride 0.9 % bolus 2,000 mL (2,000 mLs Intravenous New Bag/Given 08/06/21 1423)    ED Course  I have reviewed the triage vital signs and the nursing notes.  Pertinent labs & imaging results that were available during my care of the patient were reviewed by me and considered in my medical decision  making (see chart for details).    MDM Rules/Calculators/A&P                           35 yo F with a chief complaints of palpitations.  Patient is in sinus tachycardia.  Differential is quite broad for this.  We will give 2 L of IV fluids check  blood work reassess.  No significant anemia.  Awaiting the rest of the patient's blood work.  Patient care signed out to Dr. Silverio Lay, please see his note for further details care in the ED.  The patients results and plan were reviewed and discussed.   Any x-rays performed were independently reviewed by myself.   Differential diagnosis were considered with the presenting HPI.  Medications  sodium chloride 0.9 % bolus 2,000 mL (2,000 mLs Intravenous New Bag/Given 08/06/21 1423)    Vitals:   08/06/21 1335 08/06/21 1339 08/06/21 1400  BP:  (!) 149/88 125/81  Pulse:  (!) 140 (!) 120  Resp:  20 19  Temp: 98.2 F (36.8 C)    TempSrc: Oral    SpO2:  100% 99%    Final diagnoses:  Sinus tachycardia     Final Clinical Impression(s) / ED Diagnoses Final diagnoses:  Sinus tachycardia    Rx / DC Orders ED Discharge Orders     None        Melene Plan, DO 08/06/21 1520

## 2021-08-06 NOTE — ED Triage Notes (Signed)
Pt states she felt her heart racing as she came in to work today. She is on metoprolol for elevated heart rate, unsure what cause is yet. Sees cardiologist.

## 2021-08-06 NOTE — Discharge Instructions (Addendum)
Your heart rate is elevated.  Your potassium is slightly low and I ordered some potassium for you.  Please continue taking your metoprolol.  Please stay hydrated.  See your heart doctor for follow-up  Return to ER if you have worse palpitations, chest pain, headache, passing out.

## 2021-08-13 DIAGNOSIS — Z20822 Contact with and (suspected) exposure to covid-19: Secondary | ICD-10-CM | POA: Diagnosis not present

## 2021-08-16 DIAGNOSIS — Z20822 Contact with and (suspected) exposure to covid-19: Secondary | ICD-10-CM | POA: Diagnosis not present

## 2022-07-23 IMAGING — CR DG FOOT COMPLETE 3+V*R*
3 series · 3 of 3 positions shown · non-contrast
Comparison: None.

CLINICAL DATA: Toe fracture

EXAM:
RIGHT FOOT COMPLETE - 3+ VIEW

[t foot ap right]
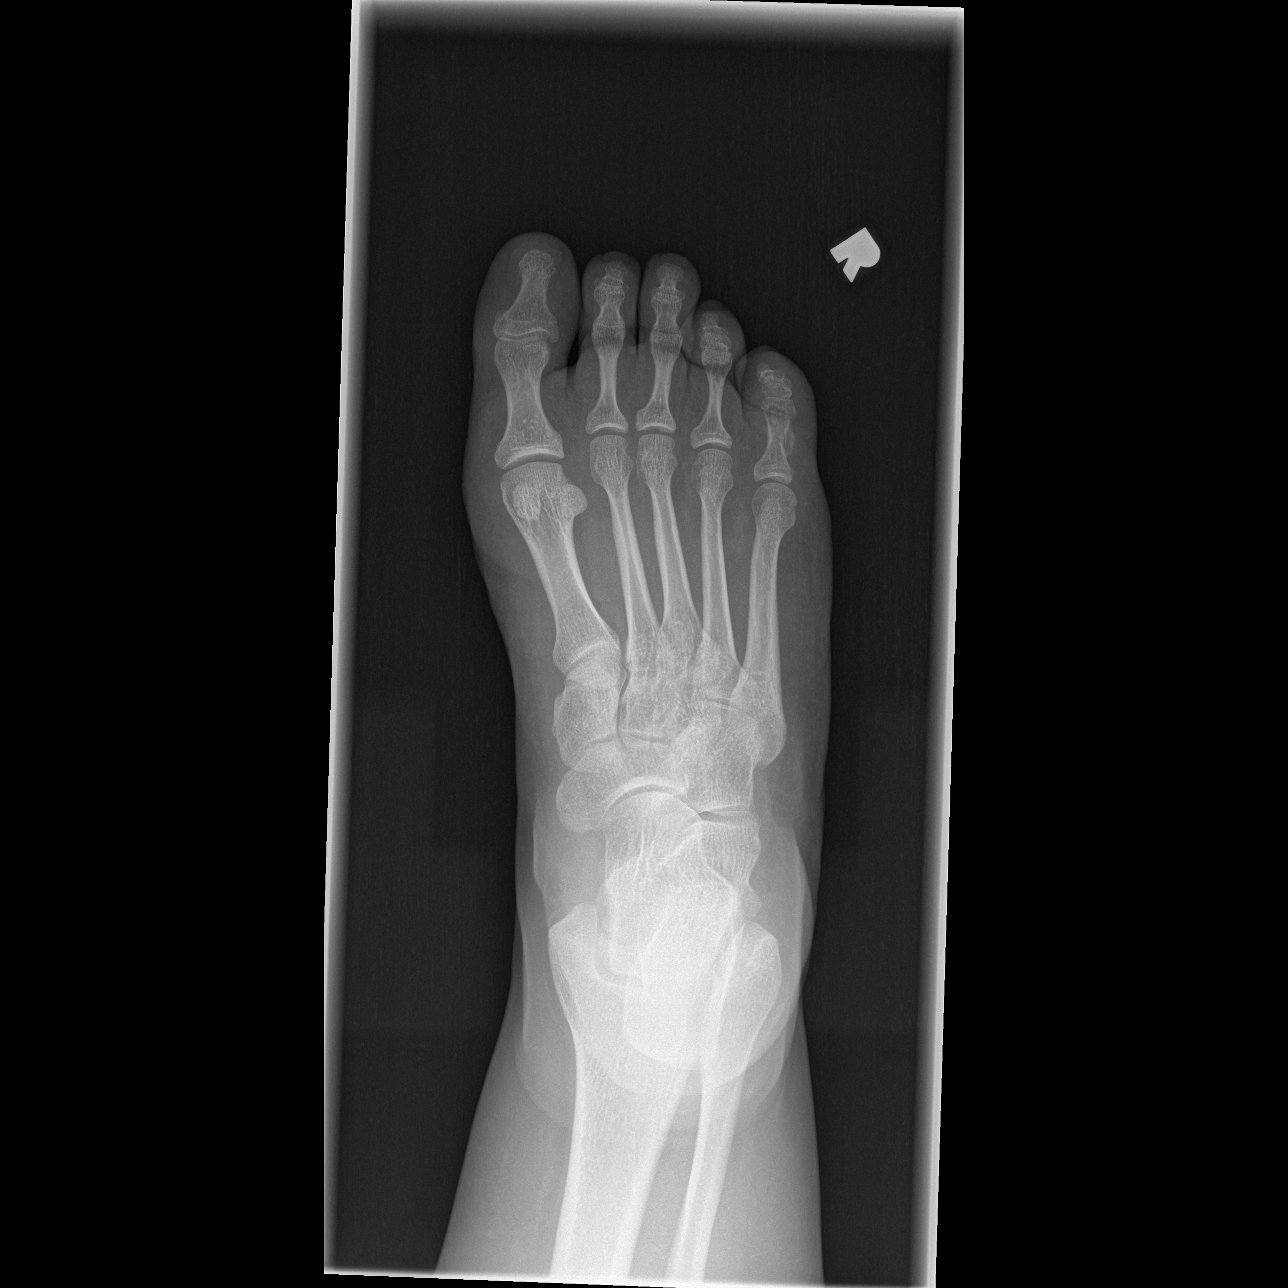

[t foot oblique right]
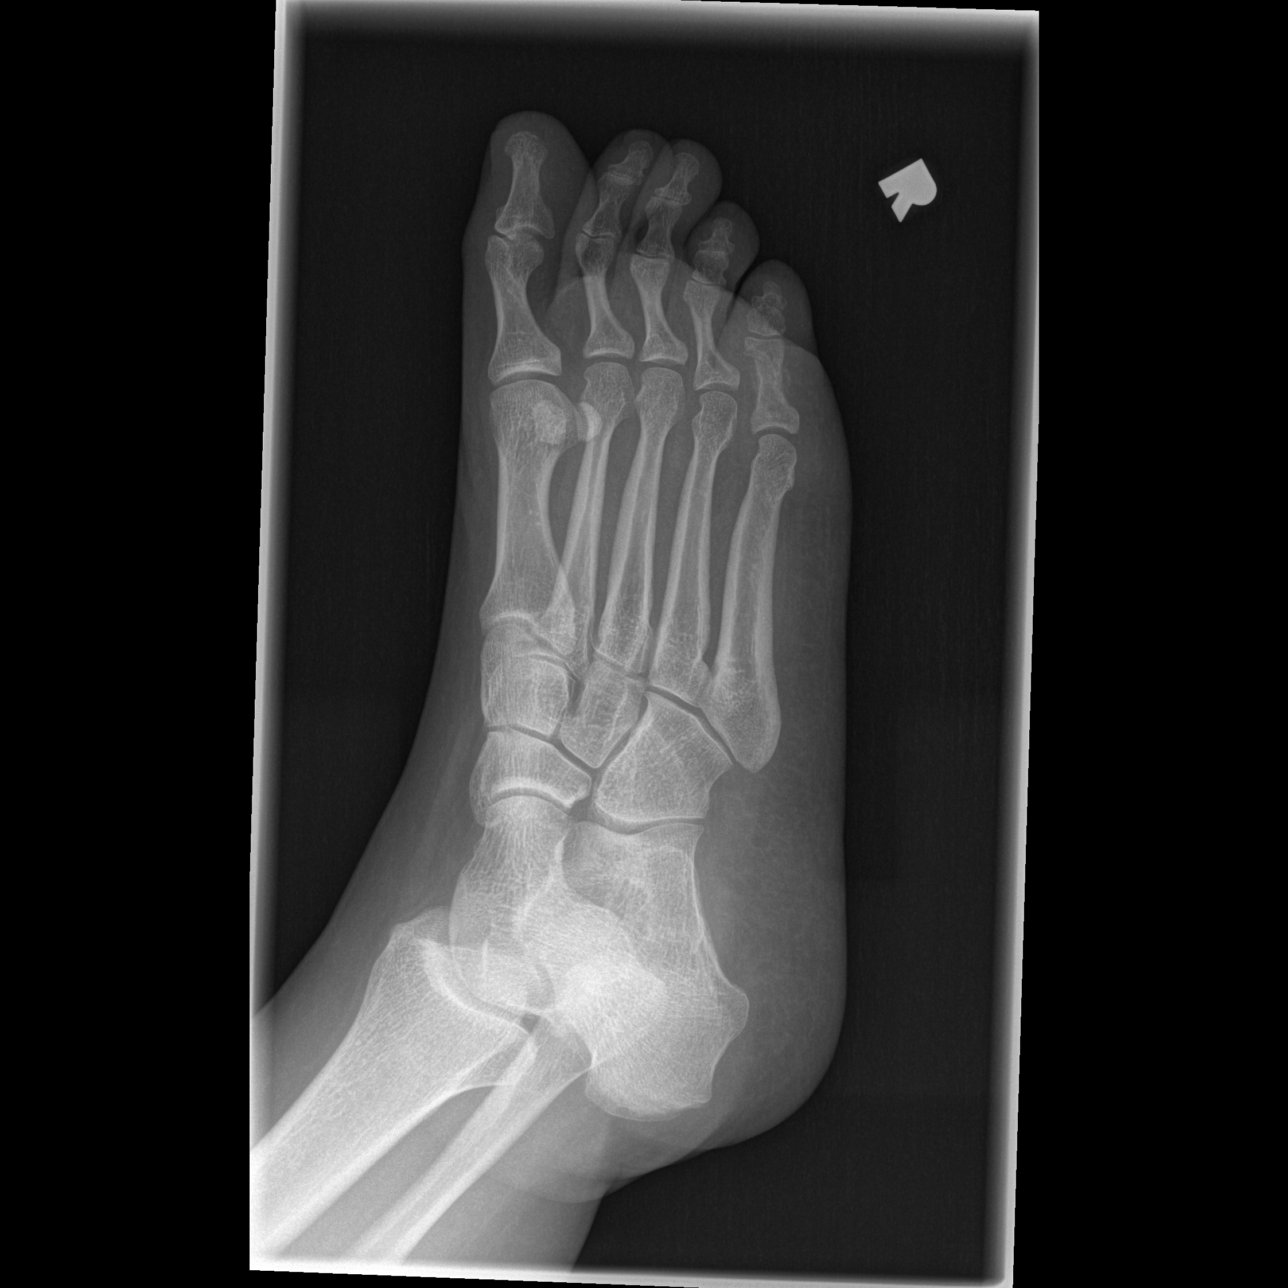

[t foot lat right]
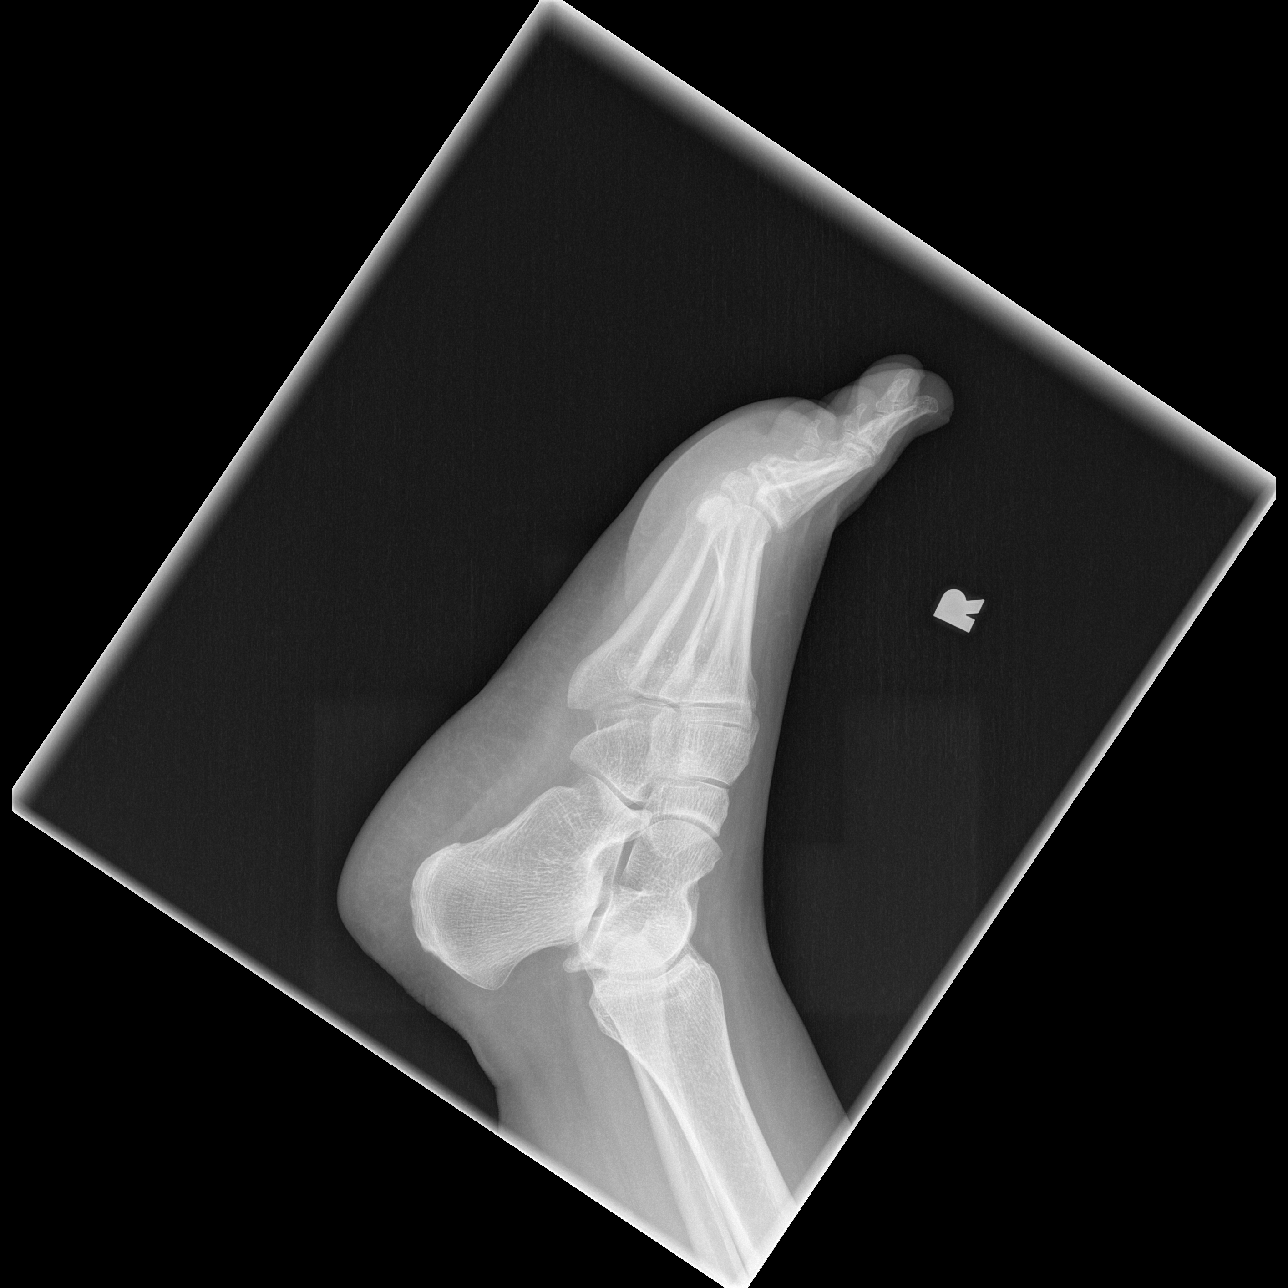

[3 of 3 positions shown; findings below may reference images not displayed]

FINDINGS: Healing nondisplaced fracture involving shaft and head of fifth
proximal phalanx. No malalignment. Positive for soft tissue swelling
IMPRESSION: Healing fracture involving fifth proximal phalanx

## 2022-09-08 IMAGING — CT CT ANGIO CHEST
2 of 8 series · 17 of 36 positions shown · IV contrast (omnipaque)
Comparison: None.

CLINICAL DATA: PE suspected, high prob.  Tachycardia

EXAM:
CT ANGIOGRAPHY CHEST WITH CONTRAST
TECHNIQUE: Multidetector CT imaging of the chest was performed using the
standard protocol during bolus administration of intravenous
contrast. Multiplanar CT image reconstructions and MIPs were
obtained to evaluate the vascular anatomy.
CONTRAST:  80mL OMNIPAQUE IOHEXOL 350 MG/ML SOLN

[Series 6: thins · axial · 0.63mm/px · z∈[-261,-23]mm · 16 of 270 slices shown]
[im 16/270  lung]
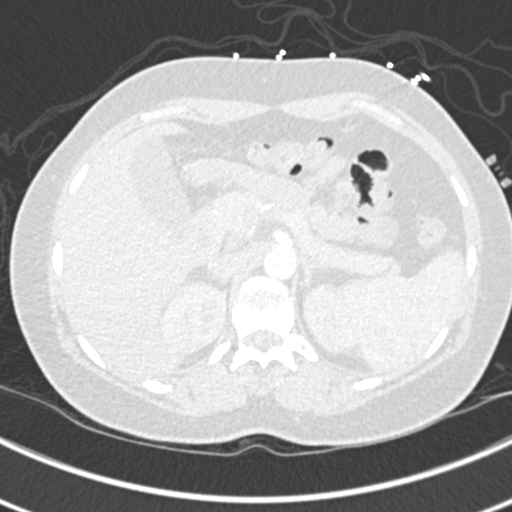
[im 32/270  mediastinal]
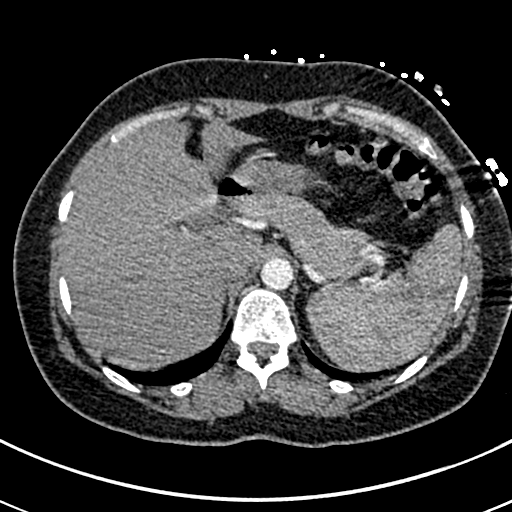
[im 48/270  lung]
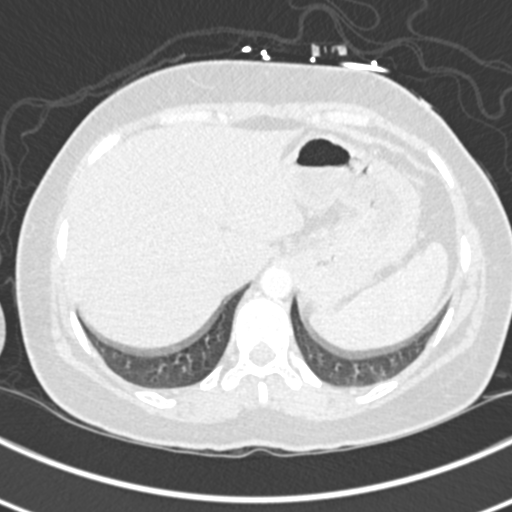
[im 64/270  mediastinal]
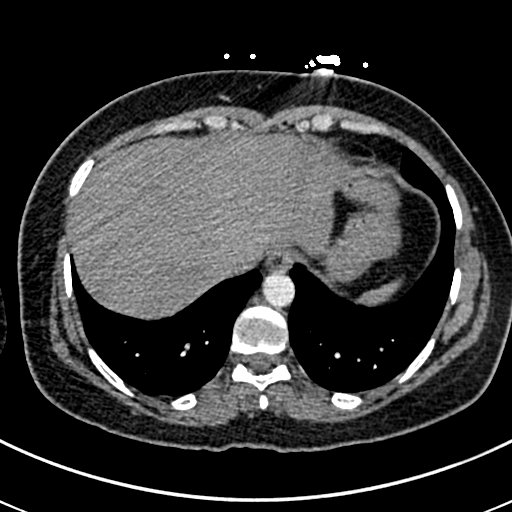
[im 80/270  lung]
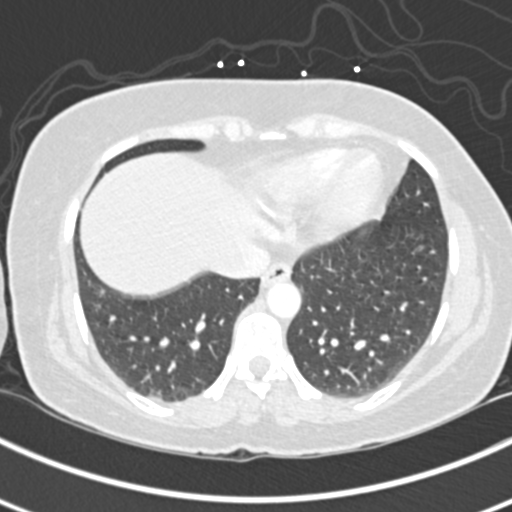
[im 95/270  mediastinal]
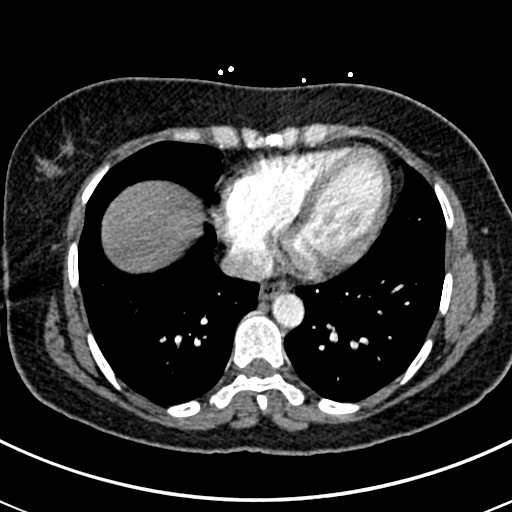
[im 111/270  lung]
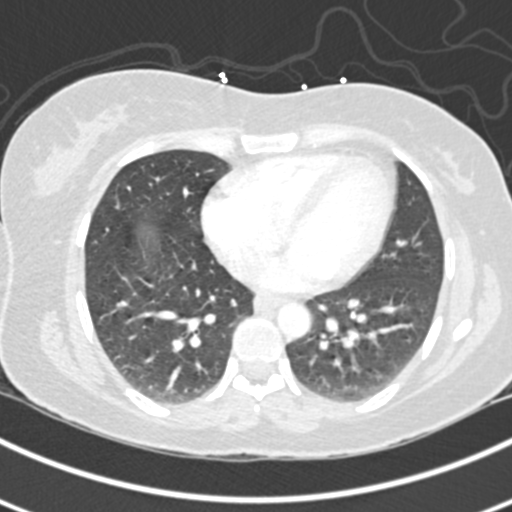
[im 127/270  mediastinal]
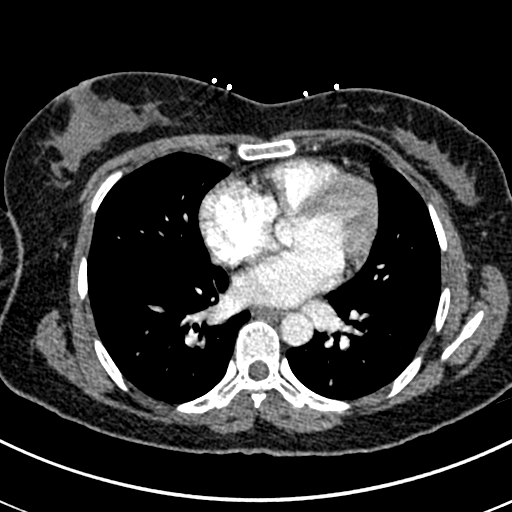
[im 143/270  lung]
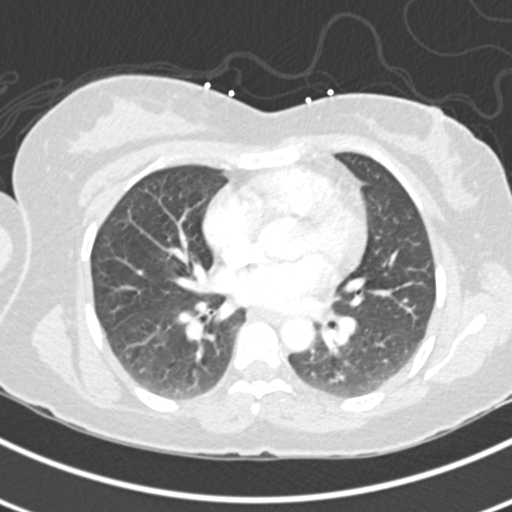
[im 159/270  mediastinal]
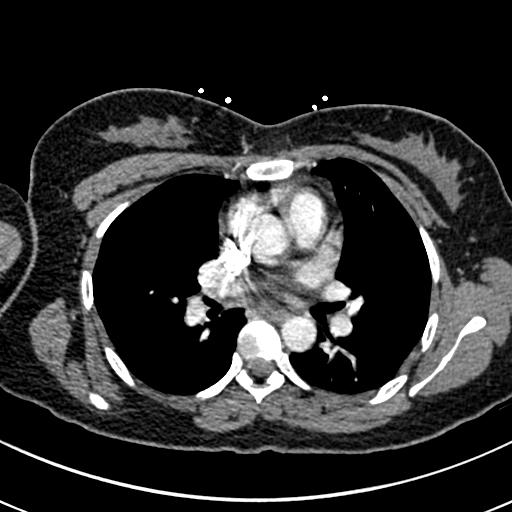
[im 175/270  lung]
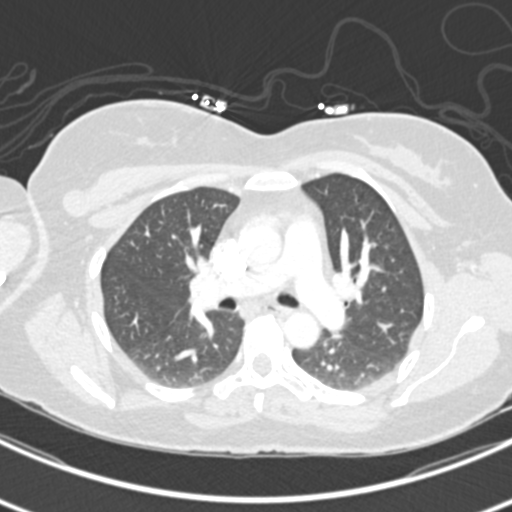
[im 190/270  mediastinal]
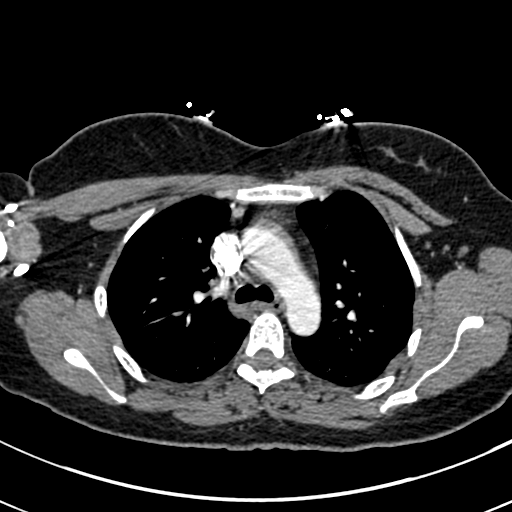
[im 206/270  lung]
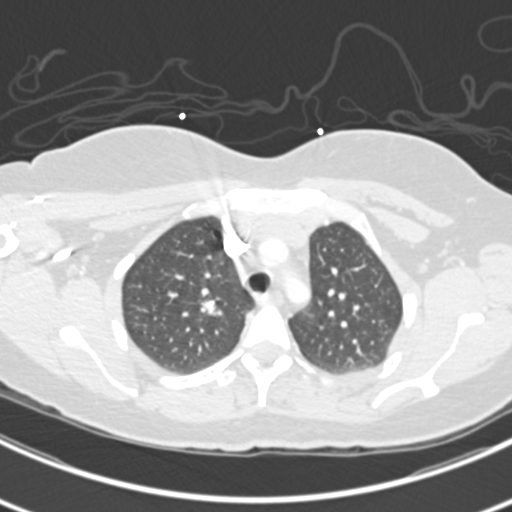
[im 222/270  mediastinal]
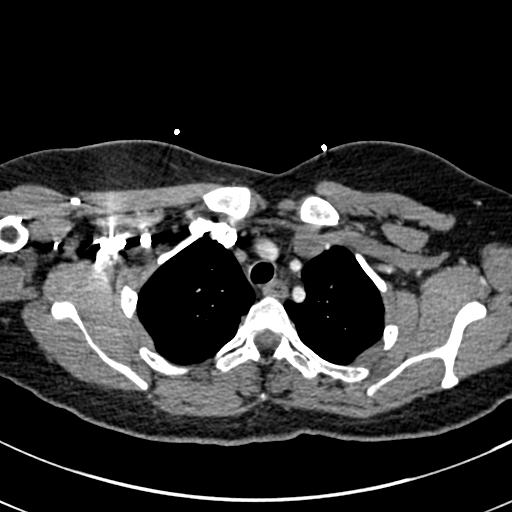
[im 238/270  lung]
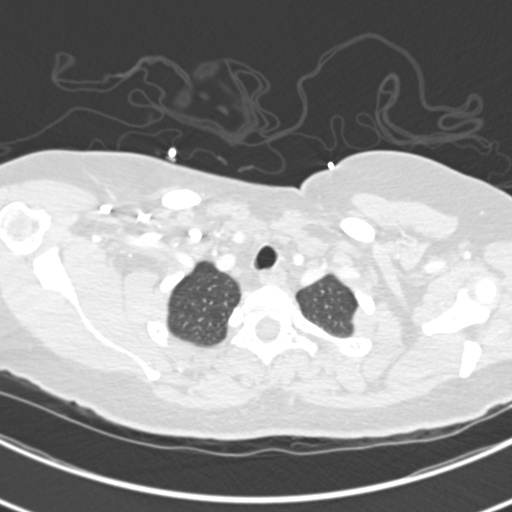
[im 254/270  mediastinal]
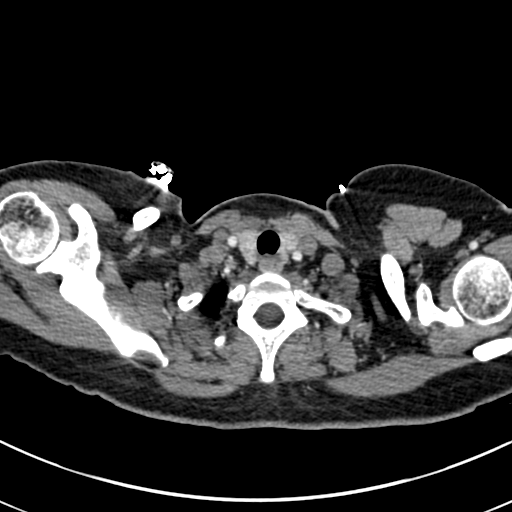

[Series 7: coronal mpr · coronal · 0.56mm/px · 1 of 123 slices shown]
[im 62/123  mediastinal]
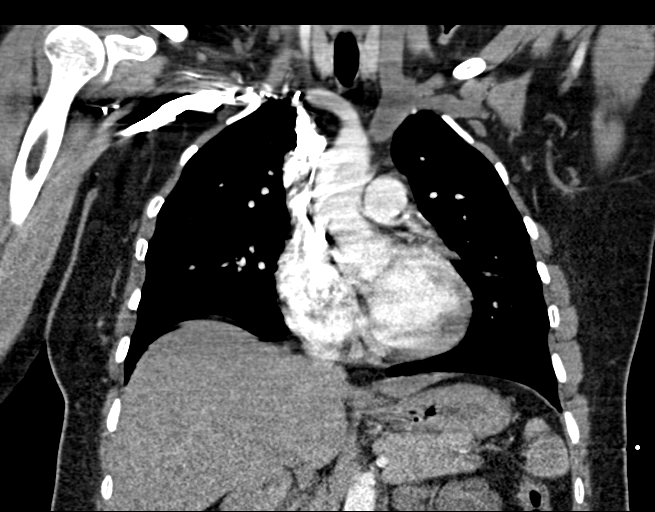

[17 of 36 positions shown; findings below may reference images not displayed]

FINDINGS: Cardiovascular: No filling defects in the pulmonary arteries to
suggest pulmonary emboli. Heart is normal size. Aorta is normal
caliber.

Mediastinum/Nodes: No mediastinal, hilar, or axillary adenopathy.
Trachea and esophagus are unremarkable. Thyroid unremarkable.

Lungs/Pleura: Lungs are clear. No focal airspace opacities or
suspicious nodules. No effusions.

Upper Abdomen: Imaging into the upper abdomen demonstrates no acute
findings.

Musculoskeletal: Chest wall soft tissues are unremarkable. No acute
bony abnormality.

Review of the MIP images confirms the above findings.
IMPRESSION: No evidence of pulmonary embolus.

No acute cardiopulmonary disease.

## 2023-02-03 ENCOUNTER — Encounter: Payer: Self-pay | Admitting: *Deleted

## 2024-09-09 ENCOUNTER — Other Ambulatory Visit: Payer: Self-pay | Admitting: Obstetrics and Gynecology

## 2024-09-09 DIAGNOSIS — R928 Other abnormal and inconclusive findings on diagnostic imaging of breast: Secondary | ICD-10-CM

## 2024-09-27 ENCOUNTER — Encounter

## 2024-09-27 ENCOUNTER — Other Ambulatory Visit

## 2024-10-01 ENCOUNTER — Ambulatory Visit: Admitting: Nurse Practitioner

## 2024-10-19 NOTE — Progress Notes (Unsigned)
 Erroneous encounter due to cancellation or no-show. Patient was not seen.

## 2024-10-20 ENCOUNTER — Encounter

## 2024-10-20 DIAGNOSIS — M79672 Pain in left foot: Secondary | ICD-10-CM
